# Patient Record
Sex: Male | Born: 1945
Health system: Southern US, Community
[De-identification: ages and names within clinical notes are randomized; demographics above are authoritative.]

## PROBLEM LIST (undated history)

## (undated) DIAGNOSIS — M1990 Unspecified osteoarthritis, unspecified site: Secondary | ICD-10-CM

## (undated) DIAGNOSIS — I10 Essential (primary) hypertension: Secondary | ICD-10-CM

## (undated) DIAGNOSIS — N400 Enlarged prostate without lower urinary tract symptoms: Secondary | ICD-10-CM

## (undated) DIAGNOSIS — G4733 Obstructive sleep apnea (adult) (pediatric): Secondary | ICD-10-CM

## (undated) DIAGNOSIS — J449 Chronic obstructive pulmonary disease, unspecified: Secondary | ICD-10-CM

## (undated) DIAGNOSIS — J309 Allergic rhinitis, unspecified: Secondary | ICD-10-CM

## (undated) DIAGNOSIS — K219 Gastro-esophageal reflux disease without esophagitis: Secondary | ICD-10-CM

## (undated) DIAGNOSIS — J189 Pneumonia, unspecified organism: Secondary | ICD-10-CM

## (undated) DIAGNOSIS — N529 Male erectile dysfunction, unspecified: Secondary | ICD-10-CM

## (undated) DIAGNOSIS — K439 Ventral hernia without obstruction or gangrene: Secondary | ICD-10-CM

## (undated) HISTORY — DX: Unspecified osteoarthritis, unspecified site: M19.90

## (undated) HISTORY — DX: Allergic rhinitis, unspecified: J30.9

## (undated) HISTORY — DX: Gastro-esophageal reflux disease without esophagitis: K21.9

## (undated) HISTORY — DX: Ventral hernia without obstruction or gangrene: K43.9

## (undated) HISTORY — PX: FINGER SURGERY: SHX640

## (undated) HISTORY — DX: Pneumonia, unspecified organism: J18.9

## (undated) HISTORY — DX: Essential (primary) hypertension: I10

## (undated) HISTORY — DX: Obstructive sleep apnea (adult) (pediatric): G47.33

## (undated) HISTORY — DX: Chronic obstructive pulmonary disease, unspecified: J44.9

## (undated) HISTORY — DX: Male erectile dysfunction, unspecified: N52.9

## (undated) HISTORY — DX: Benign prostatic hyperplasia without lower urinary tract symptoms: N40.0

## (undated) HISTORY — PX: HERNIA REPAIR: SHX51

---
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
## 1973-04-09 HISTORY — PX: OTHER SURGICAL HISTORY: SHX169

## 1979-04-10 HISTORY — PX: OTHER SURGICAL HISTORY: SHX169

## 2002-04-09 DIAGNOSIS — J189 Pneumonia, unspecified organism: Secondary | ICD-10-CM

## 2002-04-09 HISTORY — DX: Pneumonia, unspecified organism: J18.9

## 2013-04-27 DIAGNOSIS — K148 Other diseases of tongue: Secondary | ICD-10-CM | POA: Diagnosis not present

## 2013-04-27 DIAGNOSIS — L290 Pruritus ani: Secondary | ICD-10-CM | POA: Diagnosis not present

## 2013-04-27 DIAGNOSIS — H60399 Other infective otitis externa, unspecified ear: Secondary | ICD-10-CM | POA: Diagnosis not present

## 2013-05-04 DIAGNOSIS — H905 Unspecified sensorineural hearing loss: Secondary | ICD-10-CM | POA: Diagnosis not present

## 2013-05-04 DIAGNOSIS — K1321 Leukoplakia of oral mucosa, including tongue: Secondary | ICD-10-CM | POA: Diagnosis not present

## 2013-09-10 DIAGNOSIS — E785 Hyperlipidemia, unspecified: Secondary | ICD-10-CM | POA: Diagnosis not present

## 2013-09-10 DIAGNOSIS — I10 Essential (primary) hypertension: Secondary | ICD-10-CM | POA: Diagnosis not present

## 2013-09-10 DIAGNOSIS — N400 Enlarged prostate without lower urinary tract symptoms: Secondary | ICD-10-CM | POA: Diagnosis not present

## 2013-09-10 DIAGNOSIS — Z125 Encounter for screening for malignant neoplasm of prostate: Secondary | ICD-10-CM | POA: Diagnosis not present

## 2013-09-10 DIAGNOSIS — J449 Chronic obstructive pulmonary disease, unspecified: Secondary | ICD-10-CM | POA: Diagnosis not present

## 2014-01-12 DIAGNOSIS — J069 Acute upper respiratory infection, unspecified: Secondary | ICD-10-CM | POA: Diagnosis not present

## 2014-01-12 DIAGNOSIS — Z23 Encounter for immunization: Secondary | ICD-10-CM | POA: Diagnosis not present

## 2014-01-12 DIAGNOSIS — J439 Emphysema, unspecified: Secondary | ICD-10-CM | POA: Diagnosis not present

## 2014-03-15 DIAGNOSIS — J439 Emphysema, unspecified: Secondary | ICD-10-CM | POA: Diagnosis not present

## 2014-03-15 DIAGNOSIS — N400 Enlarged prostate without lower urinary tract symptoms: Secondary | ICD-10-CM | POA: Diagnosis not present

## 2014-03-15 DIAGNOSIS — I10 Essential (primary) hypertension: Secondary | ICD-10-CM | POA: Diagnosis not present

## 2014-05-24 DIAGNOSIS — J0100 Acute maxillary sinusitis, unspecified: Secondary | ICD-10-CM | POA: Diagnosis not present

## 2014-06-08 DIAGNOSIS — Z1389 Encounter for screening for other disorder: Secondary | ICD-10-CM | POA: Diagnosis not present

## 2014-06-08 DIAGNOSIS — K439 Ventral hernia without obstruction or gangrene: Secondary | ICD-10-CM | POA: Diagnosis not present

## 2014-06-08 DIAGNOSIS — Z9181 History of falling: Secondary | ICD-10-CM | POA: Diagnosis not present

## 2014-06-12 DIAGNOSIS — J0100 Acute maxillary sinusitis, unspecified: Secondary | ICD-10-CM | POA: Diagnosis not present

## 2014-06-12 DIAGNOSIS — J449 Chronic obstructive pulmonary disease, unspecified: Secondary | ICD-10-CM | POA: Diagnosis not present

## 2014-06-14 DIAGNOSIS — E669 Obesity, unspecified: Secondary | ICD-10-CM | POA: Diagnosis not present

## 2014-06-14 DIAGNOSIS — M6208 Separation of muscle (nontraumatic), other site: Secondary | ICD-10-CM | POA: Diagnosis not present

## 2014-06-14 DIAGNOSIS — Z6831 Body mass index (BMI) 31.0-31.9, adult: Secondary | ICD-10-CM | POA: Diagnosis not present

## 2014-07-13 DIAGNOSIS — N400 Enlarged prostate without lower urinary tract symptoms: Secondary | ICD-10-CM | POA: Diagnosis not present

## 2014-07-27 DIAGNOSIS — R3911 Hesitancy of micturition: Secondary | ICD-10-CM | POA: Diagnosis not present

## 2014-07-27 DIAGNOSIS — N401 Enlarged prostate with lower urinary tract symptoms: Secondary | ICD-10-CM | POA: Diagnosis not present

## 2014-08-11 DIAGNOSIS — N401 Enlarged prostate with lower urinary tract symptoms: Secondary | ICD-10-CM | POA: Diagnosis not present

## 2014-08-11 DIAGNOSIS — R3911 Hesitancy of micturition: Secondary | ICD-10-CM | POA: Diagnosis not present

## 2014-08-18 DIAGNOSIS — R3911 Hesitancy of micturition: Secondary | ICD-10-CM | POA: Diagnosis not present

## 2014-08-18 DIAGNOSIS — N401 Enlarged prostate with lower urinary tract symptoms: Secondary | ICD-10-CM | POA: Diagnosis not present

## 2014-08-31 DIAGNOSIS — B9689 Other specified bacterial agents as the cause of diseases classified elsewhere: Secondary | ICD-10-CM | POA: Diagnosis not present

## 2014-08-31 DIAGNOSIS — J208 Acute bronchitis due to other specified organisms: Secondary | ICD-10-CM | POA: Diagnosis not present

## 2014-09-13 DIAGNOSIS — Z79899 Other long term (current) drug therapy: Secondary | ICD-10-CM | POA: Diagnosis not present

## 2014-09-13 DIAGNOSIS — J449 Chronic obstructive pulmonary disease, unspecified: Secondary | ICD-10-CM | POA: Diagnosis not present

## 2014-09-13 DIAGNOSIS — I10 Essential (primary) hypertension: Secondary | ICD-10-CM | POA: Diagnosis not present

## 2014-10-14 DIAGNOSIS — Z125 Encounter for screening for malignant neoplasm of prostate: Secondary | ICD-10-CM | POA: Diagnosis not present

## 2014-10-14 DIAGNOSIS — N401 Enlarged prostate with lower urinary tract symptoms: Secondary | ICD-10-CM | POA: Diagnosis not present

## 2014-10-20 DIAGNOSIS — J449 Chronic obstructive pulmonary disease, unspecified: Secondary | ICD-10-CM | POA: Diagnosis not present

## 2014-10-22 ENCOUNTER — Encounter: Payer: Self-pay | Admitting: Pulmonary Disease

## 2014-10-25 ENCOUNTER — Ambulatory Visit (INDEPENDENT_AMBULATORY_CARE_PROVIDER_SITE_OTHER): Payer: Medicare Other | Admitting: Pulmonary Disease

## 2014-10-25 ENCOUNTER — Other Ambulatory Visit: Payer: Medicare Other

## 2014-10-25 ENCOUNTER — Encounter: Payer: Self-pay | Admitting: Pulmonary Disease

## 2014-10-25 ENCOUNTER — Ambulatory Visit (INDEPENDENT_AMBULATORY_CARE_PROVIDER_SITE_OTHER)
Admission: RE | Admit: 2014-10-25 | Discharge: 2014-10-25 | Disposition: A | Discharge status: Home / Self Care | Payer: Medicare Other | Source: Ambulatory Visit | Attending: Pulmonary Disease | Admitting: Pulmonary Disease

## 2014-10-25 ENCOUNTER — Encounter (INDEPENDENT_AMBULATORY_CARE_PROVIDER_SITE_OTHER): Payer: Self-pay

## 2014-10-25 VITALS — BP 142/88 | HR 56 | Temp 97.6°F | Ht 70.0 in | Wt 235.4 lb

## 2014-10-25 DIAGNOSIS — R3911 Hesitancy of micturition: Secondary | ICD-10-CM

## 2014-10-25 DIAGNOSIS — J309 Allergic rhinitis, unspecified: Secondary | ICD-10-CM

## 2014-10-25 DIAGNOSIS — J449 Chronic obstructive pulmonary disease, unspecified: Secondary | ICD-10-CM

## 2014-10-25 DIAGNOSIS — R0602 Shortness of breath: Secondary | ICD-10-CM | POA: Diagnosis not present

## 2014-10-25 DIAGNOSIS — R0600 Dyspnea, unspecified: Secondary | ICD-10-CM

## 2014-10-25 DIAGNOSIS — R0683 Snoring: Secondary | ICD-10-CM

## 2014-10-25 NOTE — Patient Instructions (Signed)
1)  Use over the counter nasal saline rinse (NeilMed) twice daily 2)  Continue inhalers as prescribed 3)  Checking chest X-ray, walk test, and breathing tests to determine your degree of COPD 4)  Checking you for genetic form of COPD 5)  Checking Sleep study to see if you have sleep apnea 6)  You will return to clinic in 4-6 weeks once the tests have been completed and we have obtained records from your old physician's office.

## 2014-10-25 NOTE — Progress Notes (Deleted)
   Subjective:    Patient ID: Daniel [name], male    DOB: [birth year], 69 y.o.   MRN: [id number]  HPI    Review of Systems  Constitutional: Negative for fever and unexpected weight change.  HENT: Negative for congestion, dental problem, ear pain, nosebleeds, postnasal drip, rhinorrhea, sinus pressure, sneezing, sore throat and trouble swallowing.   Eyes: Negative for redness and itching.  Respiratory: Negative for cough, chest tightness, shortness of breath and wheezing.   Cardiovascular: Negative for palpitations and leg swelling.  Gastrointestinal: Negative for nausea and vomiting.  Genitourinary: Negative for dysuria.  Musculoskeletal: Negative for joint swelling.  Skin: Negative for rash.  Neurological: Negative for headaches.  Hematological: Does not bruise/bleed easily.  Psychiatric/Behavioral: Negative for dysphoric mood. The patient is not nervous/anxious.        Objective:   Physical Exam        Assessment & Plan:

## 2014-10-25 NOTE — Progress Notes (Signed)
Subjective:    Patient ID: Daniel [name], male    DOB: April 20, 1945, 69 y.o.   MRN: [id number]  HPI He is being evaluated for removal of a bladder stone & difficulty urinating by Urology. He reports that in 2004 he was hospitalized with left-sided pneumonia and had significant "scar tissue" that resulted.  He denies any breathing problems or bronchitis as a child.  Now he gets bronchitis at least twice yearly.  He feels his dyspnea is worsening and he attributes this to weight gain.  He reports it occurs with exertion. He notes it also with bending over.  He can climb at least 3 flights of stairs without stopping. He denies any recurrent cough. He does have wheezing at times. He was diagnosed with COPD by his physician in [place], [place]. He has been been on Advair for at least a couple of years. He has no rescue inhaler currently. He previously was on Spiriva and was switched to [facility] in October 2015 without any significant benefit in terms of dyspnea relief. He denies any chest pain or pressure. No fever, chills, or sweats. No nausea, emesis or diarrhea. He does have frequent reflux induced by his diet. He uses Tums intermittently. He sleeps in a recliner. His wife reports that he snores and stops breathing at night while sleeping. He denies any headaches in the morning. He denies any routine naps. He does doze off easily. He does have problems urinating in particular starting his stream.   Review of Systems No rashes or bruising. No sinus drainage. Does have ongoing sinus congestion.A pertinent 14 point review of systems is negative except as per the history of presenting illness.     Allergies  Allergen Reactions  . Carafate [Sucralfate] Hives    Current outpatient prescriptions:  .  aspirin 81 MG tablet, Take 81 mg by mouth daily., Disp: , Rfl:  .  atenolol (TENORMIN) 25 MG tablet, Take 25 mg by mouth daily., Disp: , Rfl:  .  celecoxib (CELEBREX) 200 MG capsule, Take 200 mg by mouth daily.,  Disp: , Rfl:  .  Fluticasone-Salmeterol (ADVAIR) 250-50 MCG/DOSE AEPB, Inhale 1 puff into the lungs 2 (two) times daily., Disp: , Rfl:  .  pravastatin (PRAVACHOL) 20 MG tablet, Take 20 mg by mouth daily., Disp: , Rfl:  .  tadalafil (CIALIS) 5 MG tablet, Take 5 mg by mouth daily as needed for erectile dysfunction., Disp: , Rfl:  .  tamsulosin (FLOMAX) 0.4 MG CAPS capsule, Take 0.4 mg by mouth daily. 2 tablets once daily, Disp: , Rfl:  .  Tiotropium Bromide-Olodaterol (STIOLTO RESPIMAT) 2.5-2.5 MCG/ACT AERS, Inhale 2 puffs into the lungs daily., Disp: , Rfl:   Past Medical History  Diagnosis Date  . BPH (benign prostatic hyperplasia)   . COPD (chronic obstructive pulmonary disease)   . Abdominal wall hernia   . Pneumonia 2004    "Staph" infection in left lung  . Erectile dysfunction   . Arthritis   . Hypertension    Past Surgical History  Procedure Laterality Date  . Left knee surgery  1981  . Plastic surgery of forehead  1975  . Appendectomy  1966  . Hernia repair  2001, 2007, 2016  . Finger surgery Right     index finger   Family History  Problem Relation Age of Onset  . Colon cancer Paternal Grandfather   . Hernia Father   . Cancer Mother     breast, lung, liver  . Diabetes Maternal Grandfather   .  Heart disease Paternal Grandmother    History   Social History  . Marital Status: Married    Spouse Name: N/A  . Number of Children: N/A  . Years of Education: N/A   Occupational History  . retired    Social History Main Topics  . Smoking status: Current Some Day Smoker    Types: Cigars  . Smokeless tobacco: Not on file     Comment: only smoke a cigar when grilling out  . Alcohol Use: 0.0 oz/week    0 Standard drinks or equivalent per week  . Drug Use: Not on file  . Sexual Activity: Not on file   Other Topics Concern  . None   Social History Narrative   Quit smoking cigarettes in 2001. He smoked for 30 years at a peak rate of 2ppd. Has 2 dogs currently at  home. No prior bird exposure. 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 He recently moved to [place] from [place], [place].     Objective:   Physical Exam Blood pressure 142/88, pulse 56, temperature 97.6 F (36.4 C), temperature source Oral, height 5\' 10"  (1.778 m), weight 235 lb 6.4 oz (106.777 kg), SpO2 97 %. General:  Awake. Alert. No acute distress. Wife with the patient today. Integument:  Warm & dry. No rash on exposed skin. No bruising. Lymphatics:  No appreciated cervical or supraclavicular lymphadenoapthy. HEENT:  Moist mucus membranes. No oral ulcers. No scleral injection or icterus. PERRL. severely swollen bilateral nasal turbinates. Mallampati class III. Neck circumference 18 inches. Cardiovascular:  Regular rate. Trace lower extremity edema. No appreciable JVD.  Pulmonary:  Good aeration & clear to auscultation bilaterally. Symmetric chest wall expansion. No accessory muscle use. Abdomen: Soft. Normal bowel sounds. Nondistended. Grossly nontender. Ventral abdominal hernia noted. Musculoskeletal:  Normal bulk and tone. Hand grip strength 5/5 bilaterally. No joint deformity or effusion appreciated. Neurological:  CN 2-12 grossly in tact. No meningismus. Moving all 4 extremities equally. Symmetric patellar deep tendon reflexes. Psychiatric:  Mood and affect congruent. Speech normal rhythm, rate & tone.      Assessment & Plan:  69 year old male previously diagnosed with COPD with history of severe left-sided pneumonia hospitalized in 2004. Patient continues to have progressively worsening dyspnea on exertion that he attributes to weight gain and his underlying COPD. Advair and Stiolto have been of little help to the patient. Previously he was also on Spiriva. He is currently undergoing evaluation for a urological procedure under general anesthesia. In reviewing the  patient's history with him and his wife he seems to have symptoms suggestive of underlying sleep apnea. He admits he is incompletely adherent to his inhaler medications. Further investigation into the severity of the patient's underlying lung disease will need to be performed before an accurate risk assessment for his pending procedure can be done. Patient counseled to contact my office if he had any further questions or concerns before his next appointment.  1. COPD: Unclear severity but ongoing symptoms therefore uncontrolled. Checking for pulmonary function testing with bronchodilator challenge. Screening for alpha-1 antitrypsin with serum testing today. Patient continuing on his current inhaler regimen. 2. Snoring: Suspect underlying sleep apnea. Ordering polysomnogram. 3. Dyspnea: Checking chest x-ray PA/lateral & 6 minute walk test. Suspect secondary to underlying COPD. 4. Allergic rhinitis: Checking serum IgE & RAST panel. Patient counseled to use nasal saline  rinse twice daily. 5. Planned urological surgery under general anesthesia: Unable to accurately assess the patient's perioperative pulmonary risk of complications. I suspect he would be at moderate risk given his ongoing symptoms at this time. 6. Follow-up: Patient will return to clinic in 4-6 weeks or sooner if needed.

## 2014-10-26 LAB — IGE: IgE (Immunoglobulin E), Serum: 376 kU/L — ABNORMAL HIGH (ref <115)

## 2014-10-27 LAB — ALLERGEN PROFILE, PERENNIAL ALLERGEN IGE
Aspergillus Fumigatus IgE: <0.1 kU/L
Aureobasidi Pullulans IgE: <0.1 kU/L
Candida Albicans IgE: 0.23 kU/L — AB
Cat Dander IgE: <0.1 kU/L
Cladosporium Herbarum IgE: <0.1 kU/L
Cow Dander IgE: <0.1 kU/L
D Farinae IgE: <0.1 kU/L
Dog Dander IgE: <0.1 kU/L
Duck Feathers IgE: <0.1 kU/L
Goose Feathers IgE: <0.1 kU/L
Mouse Urine IgE: <0.1 kU/L
Mucor Racemosus IgE: <0.1 kU/L
Phoma Betae IgE: <0.1 kU/L
Setomelanomma Rostrat: <0.1 kU/L

## 2014-10-27 LAB — ALPHA-1-ANTITRYPSIN: A1 ANTITRYPSIN SER: 138 mg/dL (ref 83–199)

## 2014-11-07 ENCOUNTER — Ambulatory Visit (HOSPITAL_BASED_OUTPATIENT_CLINIC_OR_DEPARTMENT_OTHER): Payer: Medicare Other | Attending: Pulmonary Disease

## 2014-11-07 VITALS — Ht 70.0 in | Wt 230.0 lb

## 2014-11-07 DIAGNOSIS — R0683 Snoring: Secondary | ICD-10-CM | POA: Diagnosis not present

## 2014-11-07 DIAGNOSIS — G4733 Obstructive sleep apnea (adult) (pediatric): Secondary | ICD-10-CM | POA: Diagnosis not present

## 2014-11-07 DIAGNOSIS — G4736 Sleep related hypoventilation in conditions classified elsewhere: Secondary | ICD-10-CM | POA: Insufficient documentation

## 2014-11-07 DIAGNOSIS — G4730 Sleep apnea, unspecified: Secondary | ICD-10-CM

## 2014-11-08 ENCOUNTER — Ambulatory Visit: Payer: Medicare Other | Admitting: Pulmonary Disease

## 2014-11-08 ENCOUNTER — Ambulatory Visit (INDEPENDENT_AMBULATORY_CARE_PROVIDER_SITE_OTHER): Payer: Medicare Other | Admitting: Pulmonary Disease

## 2014-11-08 DIAGNOSIS — J449 Chronic obstructive pulmonary disease, unspecified: Secondary | ICD-10-CM

## 2014-11-08 DIAGNOSIS — R0600 Dyspnea, unspecified: Secondary | ICD-10-CM

## 2014-11-08 LAB — PULMONARY FUNCTION TEST
DL/VA % pred: 58 %
DL/VA: 2.68 ml/min/mmHg/L
DLCO UNC % PRED: 42 %
DLCO unc: 13.63 ml/min/mmHg
FEF 25-75 Post: 1.16 L/sec
FEF 25-75 Pre: 0.71 L/sec
FEF2575-%Change-Post: 63 %
FEF2575-%PRED-POST: 44 %
FEF2575-%PRED-PRE: 27 %
FEV1-%Change-Post: 18 %
FEV1-%Pred-Post: 67 %
FEV1-%Pred-Pre: 57 %
FEV1-Post: 2.26 L
FEV1-Pre: 1.9 L
FEV1FVC-%CHANGE-POST: 15 %
FEV1FVC-%Pred-Pre: 75 %
FEV6-%CHANGE-POST: 6 %
FEV6-%PRED-POST: 79 %
FEV6-%PRED-PRE: 74 %
FEV6-POST: 3.37 L
FEV6-Pre: 3.18 L
FEV6FVC-%Change-Post: 4 %
FEV6FVC-%PRED-POST: 102 %
FEV6FVC-%Pred-Pre: 97 %
FVC-%Change-Post: 3 %
FVC-%Pred-Post: 78 %
FVC-%Pred-Pre: 75 %
FVC-POST: 3.53 L
FVC-PRE: 3.42 L
POST FEV6/FVC RATIO: 97 %
PRE FEV1/FVC RATIO: 56 %
Post FEV1/FVC ratio: 64 %
Pre FEV6/FVC Ratio: 93 %
RV % pred: 108 %
RV: 2.61 L
TLC % pred: 89 %
TLC: 6.26 L

## 2014-11-08 NOTE — Progress Notes (Signed)
Six-Minute Walk Test 11/08/14:  Walked 492 meters / Baseline Sat 94% on Ra / Nadir Sat 90% on RA  PFT 11/08/14: FVC 3.42 L (75%) FEV1 1.90 L (57%) FEV1/FVC 0.56 FEF 25-75 0.71 L (27%) positive bronchodilator response TLC 6.26 L (89%) RV 180% ERV 68% DLCO uncorrected 42%

## 2014-11-16 DIAGNOSIS — G4733 Obstructive sleep apnea (adult) (pediatric): Secondary | ICD-10-CM | POA: Diagnosis not present

## 2014-11-17 ENCOUNTER — Telehealth: Payer: Self-pay | Admitting: *Deleted

## 2014-11-17 DIAGNOSIS — R3911 Hesitancy of micturition: Secondary | ICD-10-CM | POA: Diagnosis not present

## 2014-11-17 DIAGNOSIS — G4733 Obstructive sleep apnea (adult) (pediatric): Secondary | ICD-10-CM

## 2014-11-17 DIAGNOSIS — N401 Enlarged prostate with lower urinary tract symptoms: Secondary | ICD-10-CM | POA: Diagnosis not present

## 2014-11-17 NOTE — Telephone Encounter (Signed)
I spoke with patient about results and he verbalized understanding and had no questions. cpap titration ordered

## 2014-11-17 NOTE — Telephone Encounter (Signed)
-----   Message from Roslynn Amble, MD sent at 11/17/2014  4:55 PM EDT ----- Ami Thornsberry,   Please order a CPAP titration study for the patient's severe OSA and call him to let him know.   Thanks. ----- Message -----    From: Coralyn Helling, MD    Sent: 11/17/2014  10:49 AM      To: Roslynn Amble, MD  Marlyne Beards,  Sleep study for Mr. Knotts showed severe OSA (AHI 35.5, SaO2 low 81%).  Sleep study will be entered into epic later this week.  Thanks.  [place]

## 2014-11-22 NOTE — Progress Notes (Signed)
Patient Name: Daniel [name], Daniel [name] Date: 11/07/2014 Gender: Male D.O.B: [birth year] Age (years): 68 Referring Provider: Roslynn Amble Height (inches): 70 Interpreting Physician: Coralyn Helling MD, ABSM Weight (lbs): 230 RPSGT: Cherylann Parr BMI: 33 MRN: [id number] Neck Size: 17.00 CLINICAL INFORMATION Sleep Study Type: NPSG Indication for sleep study: Snoring, Witnesses Apnea / Gasping During Sleep Epworth Sleepiness Score: SLEEP STUDY TECHNIQUE As per the AASM Manual for the Scoring of Sleep and Associated Events v2.3 (April 2016) with a hypopnea requiring 4% desaturations. The channels recorded and monitored were frontal, central and occipital EEG, electrooculogram (EOG), submentalis EMG (chin), nasal and oral airflow, thoracic and abdominal wall motion, anterior tibialis EMG, snore microphone, electrocardiogram, and pulse oximetry. MEDICATIONS Patient's medications include: reviewed in electronic medical record. Medications self-administered by patient during sleep study : No sleep medicine administered. SLEEP ARCHITECTURE The study was initiated at 10:06:12 PM and ended at 4:59:35 AM. Sleep onset time was 7.3 minutes and the sleep efficiency was 83.4%. The total sleep time was 344.6 minutes. Stage REM latency was 120.0 minutes. The patient spent 11.61% of the night in stage N1 sleep, 73.16% in stage N2 sleep, 0.00% in stage N3 and 15.23% in REM. Alpha intrusion was absent. Supine sleep was 55.86%. RESPIRATORY PARAMETERS The overall apnea/hypopnea index (AHI) was 35.5 per hour. There were 197 total apneas, including 196 obstructive, 0 central and 1 mixed apneas. There were 7 hypopneas and 11 RERAs. The AHI during Stage REM sleep was 16.0 per hour. AHI while supine was 54.5 per hour. The mean oxygen saturation was 91.44%. The minimum SpO2 during sleep was 81.00%. Loud snoring was noted during this study. CARDIAC DATA The 2 lead EKG demonstrated sinus rhythm. The mean heart rate  was 55.43 beats per minute. Other EKG findings include: None. LEG MOVEMENT DATA The total PLMS were 40 with a resulting PLMS index of 6.96. Associated arousal with leg movement index was 0.0. IMPRESSIONS Severe obstructive sleep apnea occurred during this study (AHI = 35.5/h). No significant central sleep apnea occurred during this study (CAI = 0.0/h). Mild oxygen desaturation was noted during this study (Min O2 = 81.00%). The patient snored with Loud snoring volume. No cardiac abnormalities were noted during this study. Mild periodic limb movements of sleep occurred during the study. No significant associated arousals. DIAGNOSIS Obstructive Sleep Apnea (327.23 [G47.33 ICD-10]) Nocturnal Hypoxemia (327.26 [G47.36 ICD-10]) RECOMMENDATIONS Additional therapies include CPAP, oral appliance, or surgical assessment. Avoid alcohol, sedatives and other CNS depressants that may worsen sleep apnea and disrupt normal sleep architecture. Sleep hygiene should be reviewed to assess factors that may improve sleep quality. Weight management and regular exercise should be initiated or continued if appropriate.  [Electronically signed] 11/17/2014 10:49 AM Coralyn Helling MD, ABSM Diplomate, American Board of Sleep Medicine NPI: [id number]

## 2014-12-06 ENCOUNTER — Encounter: Payer: Self-pay | Admitting: Pulmonary Disease

## 2014-12-06 ENCOUNTER — Ambulatory Visit (INDEPENDENT_AMBULATORY_CARE_PROVIDER_SITE_OTHER): Payer: Medicare Other | Admitting: Pulmonary Disease

## 2014-12-06 ENCOUNTER — Other Ambulatory Visit (INDEPENDENT_AMBULATORY_CARE_PROVIDER_SITE_OTHER): Payer: Medicare Other

## 2014-12-06 VITALS — BP 126/72 | HR 70 | Ht 70.0 in | Wt 241.6 lb

## 2014-12-06 DIAGNOSIS — R011 Cardiac murmur, unspecified: Secondary | ICD-10-CM

## 2014-12-06 DIAGNOSIS — J449 Chronic obstructive pulmonary disease, unspecified: Secondary | ICD-10-CM | POA: Diagnosis not present

## 2014-12-06 DIAGNOSIS — G4734 Idiopathic sleep related nonobstructive alveolar hypoventilation: Secondary | ICD-10-CM

## 2014-12-06 DIAGNOSIS — G4733 Obstructive sleep apnea (adult) (pediatric): Secondary | ICD-10-CM | POA: Diagnosis not present

## 2014-12-06 DIAGNOSIS — R0600 Dyspnea, unspecified: Secondary | ICD-10-CM

## 2014-12-06 DIAGNOSIS — R0609 Other forms of dyspnea: Secondary | ICD-10-CM | POA: Diagnosis not present

## 2014-12-06 DIAGNOSIS — R609 Edema, unspecified: Secondary | ICD-10-CM

## 2014-12-06 DIAGNOSIS — J309 Allergic rhinitis, unspecified: Secondary | ICD-10-CM

## 2014-12-06 LAB — BRAIN NATRIURETIC PEPTIDE: Pro B Natriuretic peptide (BNP): 68 pg/mL (ref 0.0–100.0)

## 2014-12-06 MED ORDER — BUDESONIDE-FORMOTEROL FUMARATE 160-4.5 MCG/ACT IN AERO: 2.0000 | INHALATION_SPRAY | Freq: Two times a day (BID) | RESPIRATORY_TRACT | Status: DC

## 2014-12-06 MED ORDER — ALBUTEROL SULFATE HFA 108 (90 BASE) MCG/ACT IN AERS: 2.0000 | INHALATION_SPRAY | RESPIRATORY_TRACT | Status: DC | PRN

## 2014-12-06 MED ORDER — TIOTROPIUM BROMIDE MONOHYDRATE 1.25 MCG/ACT IN AERS: 2.0000 | INHALATION_SPRAY | Freq: Every day | RESPIRATORY_TRACT | Status: DC

## 2014-12-06 NOTE — Progress Notes (Signed)
Subjective:    Patient ID: Daniel [name], male    DOB: 03/14/1946, 69 y.o.   MRN: [id number]  C.C.:  Follow-up for Moderate-Severe COPD, Allergic Rhinitis, & Severe OSA.  HPI Moderate-Severe COPD:  Reports he has been compliant with his Advair & Stiolto. Hasn't noticed a significant change since switching from Spiriva [employer] to [employer]. Continues to have dyspnea on exertion. He continues to have intermittent wheezing. Still having intermittent cough that he describes as being triggered by an "itch" in the back of his throat. He doesn't have a rescue inhaler. No nocturnal awakenings with coughing.  Allergic Rhinitis: IgE was elevated on serum testing but RAST panel was unrevealing. Patient counseled to use nasal saline rinse twice daily. He hasn't noticed a significant difference. Denies any changes in his baseline sinus congestion or pressure.  Severe OSA: Since last appointment patient underwent polysomnogram which showed severe OSA without significant central sleep apnea. Patient was also found to have nocturnal hypoxemia during testing. He has been scheduled for a CPAP titration.  Review of Systems He denies any chest pain or pressure. No nausea, emesis, or diarrhea.     Allergies  Allergen Reactions  . Carafate [Sucralfate] Hives    Current outpatient prescriptions:  .  aspirin 81 MG tablet, Take 81 mg by mouth daily., Disp: , Rfl:  .  atenolol (TENORMIN) 25 MG tablet, Take 25 mg by mouth daily., Disp: , Rfl:  .  celecoxib (CELEBREX) 200 MG capsule, Take 200 mg by mouth daily., Disp: , Rfl:  .  Fluticasone-Salmeterol (ADVAIR) 250-50 MCG/DOSE AEPB, Inhale 1 puff into the lungs 2 (two) times daily., Disp: , Rfl:  .  pravastatin (PRAVACHOL) 20 MG tablet, Take 20 mg by mouth daily., Disp: , Rfl:  .  tadalafil (CIALIS) 5 MG tablet, Take 5 mg by mouth daily as needed for erectile dysfunction., Disp: , Rfl:  .  tamsulosin (FLOMAX) 0.4 MG CAPS capsule, Take 0.4 mg by mouth daily. 2  tablets once daily, Disp: , Rfl:  .  Tiotropium Bromide-Olodaterol (STIOLTO RESPIMAT) 2.5-2.5 MCG/ACT AERS, Inhale 2 puffs into the lungs daily., Disp: , Rfl:   Past Medical History  Diagnosis Date  . BPH (benign prostatic hyperplasia)   . COPD (chronic obstructive pulmonary disease)   . Abdominal wall hernia   . Pneumonia 2004    "Staph" infection in left lung  . Erectile dysfunction   . Arthritis   . Hypertension   . GERD (gastroesophageal reflux disease)   . OSA (obstructive sleep apnea)   . Allergic rhinitis    Past Surgical History  Procedure Laterality Date  . Left knee surgery  1981  . Plastic surgery of forehead  1975  . Appendectomy  1966  . Hernia repair  2001, 2007, 2016  . Finger surgery Right     index finger   Family History  Problem Relation Age of Onset  . Colon cancer Paternal Grandfather   . Hernia Father   . Cancer Mother     breast, lung, liver  . Diabetes Maternal Grandfather   . Heart disease Paternal Grandmother    Social History   Social History  . Marital Status: Married    Spouse Name: N/A  . Number of Children: N/A  . Years of Education: N/A   Occupational History  . retired    Social History Main Topics  . Smoking status: Current Some Day Smoker    Types: Cigars  . Smokeless tobacco: Not on file  Comment: only smoke a cigar when grilling out  . Alcohol Use: 0.0 oz/week    0 Standard drinks or equivalent per week  . Drug Use: Not on file  . Sexual Activity: Not on file   Other Topics Concern  . Not on file   Social History Narrative   Quit smoking cigarettes in 2001. He smoked for 30 years at a peak rate of 2ppd. Has 2 dogs currently at home. No prior bird exposure. 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 He recently moved to [place] from [place], [place].     Objective:   Physical Exam Blood  pressure 126/72, pulse 70, height 5\' 10"  (1.778 m), weight 241 lb 9.6 oz (109.589 kg), SpO2 94 %. General:  Awake. Alert. Obese Caucasian male seen. Wife again with the patient today. Integument:  Warm & dry. No rash on exposed skin. No bruising. HEENT:  Moist mucus membranes. No oral ulcers. Swollen bilateral nasal turbinates right greater than left with moderate obstruction in the nasal passage on the right. Cardiovascular:  Regular rate. Regular rhythm. 1+ pitting bilateral lower extremity edema increased since last appointment.  Pulmonary:  Good aeration bilaterally but very mild crackles bilateral lung bases. Symmetric chest wall expansion. No accessory muscle use on room air. Abdomen: Soft. Normal bowel sounds. Protuberant. Grossly nontender. Ventral abdominal hernia noted. Musculoskeletal:  Normal bulk and tone. Hand grip strength 5/5 bilaterally. No joint deformity or effusion appreciated.  PFT 11/08/14: FVC 3.42 L (75%) FEV1 1.90 L (57%) FEV1/FVC 0.56 FEF 25-75 0.71 L (27%) positive bronchodilator response TLC 6.26 L (89%) RV 180% ERV 68% DLCO uncorrected 42%  11/08/14: Walked 492 meters / Baseline Sat 94% on RA / Nadir Sat 90% on RA  POLYSOMNOGRAM 11/08/14:  Severe OSA with AHI 35.5 events/hour. Central sleep apnea 0.0 events/hour. Lowest saturation 81% during study. No cardiac abnormalities were noted. Mild periodic limb movement of sleep occurred during study without significant arousal.  IMAGING CXR PA/LAT 10/25/14 (personally reviewed by me): Mild hyperinflation with flattening of the diaphragms. No parenchymal nodule or opacity appreciated. No pleural effusion or thickening appreciated. Heart normal in size. Mediastinum normal in contour.  LABS 10/25/14 Alpha-1 antitrypsin: 138 IgE: 376 RAST panel: Candida albicans 0.23 (class 0/1)    Assessment & Plan:  69 year old male with proposed urological surgery under general anesthesia. Given the severity of the patient's COPD  and obstructive sleep apnea I believe he remains at a moderate risk of perioperative pulmonary complications. I am attempting to adjust his inhaler regimen to improve drug delivery and maximize his bronchodilatation in an effort to improve his exercise capacity and control of symptoms. His previous spirometry did show moderate-severe COPD with a significant bronchodilator response. Additionally he has trapping on his lung volumes as well as a substantially decreased carbon dioxide diffusion capacity. Despite this the patient had no significant desaturation during his 6 minute walk test. I personally reviewed the results of his chest x-ray and polysomnogram which have been performed since last appointment. His polysomnogram does have severe obstructive sleep apnea and we reviewed the risk factors for untreated sleep apnea during his visit today with his wife present. Given the degree of obstruction in his right nasal passage I feel it's reasonable to refer him to ENT for evaluation and potential treatment options. As an additional thought of concern his  lower extremity edema has increased significantly since last appointment and when coupled with the mild crackles on physical exam, the severity of his lung disease, and severity of his obstructive sleep apnea I am concerned about the potential for cor pulmonale/pulmonary hypertension. I am investigating this further with serum testing as well as with a transthoracic echocardiogram. I instructed the patient on proper technique with both of his new inhalers and instructed him to contact my office if he had any further questions or concerns before his next appointment.  1. Moderate-severe COPD: Checking alpha-1 antitrypsin phenotype/genotype today. Stopping Advair & Stiolto. Starting Symbicort 160/4.5 with spacer & Spiriva Respimat. Also prescribing albuterol inhaler to use as needed. Plan to repeat spirometry with bronchodilator challenge at next appointment to ensure  maximal bronchodilatation as well as 6 minute walk test at next appointment to examine functional capacity. 2. Severe OSA with nocturnal hypoxia: Patient is currently scheduled for a CPAP titration. He will keep this appointment. I will plan to see the patient back 30 days after he initiates CPAP therapy with a compliance download. 3. Allergic rhinitis/rhinosinusitis/obstruction of right nasal passage: Referring the patient to ENT for evaluation and treatment options. Continuing nasal saline rinse twice daily. 4. Bilateral lower extremity edema: Checking serum BNP today. Also checking transthoracic echocardiogram to evaluate right ventricular function. 5. General anesthesia for urological procedure: Patient is a moderate risk of perioperative complications from the patient's proposed surgery with general anesthesia. I'm adjusting his inhaled medication regimen today and I will reassess the patient back quickly. However, this should not preclude his ability to undergo the procedure whenever he and his surgeon wish to undertake it with the appropriate precautions by anesthesia. Albuterol & ipratropium bromide nebulizers can be used for any wheezing on physical exam before, during, or postintubation. I would also recommend minimizing sedating medications prior to extubation to prevent any worsening of CO2 retention from his OSA. 6. Follow-up: Patient to return to clinic in 4-6 weeks.

## 2014-12-06 NOTE — Patient Instructions (Signed)
1. You will be stopping Stiolto & Advair today. 2. Start taking Symbicort inhaler with a spacer twice daily as directed. Please remember to rinse, gargle, and spit afterwards to avoid getting thrush. Remember to rinse/washout your spacer twice a day after using it. 3. Start taking Spiriva Respimat inhaler tomorrow morning. 4. I'm checking a blood test to determine if the swelling in her legs is from your heart as well as an echocardiogram of your heart to see how it is working. 5. We will repeat a breathing test and walking test at her next appointment. 6. You will return to clinic in 4-6 weeks but please contact my office if you have any further questions or concerns.

## 2014-12-08 LAB — ALPHA-1-ANTITRYPSIN: A1 ANTITRYPSIN SER: 133 mg/dL (ref 83–199)

## 2014-12-09 ENCOUNTER — Ambulatory Visit: Payer: Medicare Other | Admitting: Pulmonary Disease

## 2014-12-10 ENCOUNTER — Other Ambulatory Visit: Payer: Self-pay

## 2014-12-10 ENCOUNTER — Ambulatory Visit (HOSPITAL_COMMUNITY): Payer: Medicare Other | Attending: Cardiology

## 2014-12-10 DIAGNOSIS — I517 Cardiomegaly: Secondary | ICD-10-CM | POA: Diagnosis not present

## 2014-12-10 DIAGNOSIS — F17200 Nicotine dependence, unspecified, uncomplicated: Secondary | ICD-10-CM | POA: Insufficient documentation

## 2014-12-10 DIAGNOSIS — R609 Edema, unspecified: Secondary | ICD-10-CM

## 2014-12-10 DIAGNOSIS — R0600 Dyspnea, unspecified: Secondary | ICD-10-CM

## 2014-12-10 DIAGNOSIS — G4733 Obstructive sleep apnea (adult) (pediatric): Secondary | ICD-10-CM

## 2014-12-10 DIAGNOSIS — J449 Chronic obstructive pulmonary disease, unspecified: Secondary | ICD-10-CM | POA: Diagnosis not present

## 2014-12-10 DIAGNOSIS — R011 Cardiac murmur, unspecified: Secondary | ICD-10-CM

## 2014-12-10 DIAGNOSIS — G4734 Idiopathic sleep related nonobstructive alveolar hypoventilation: Secondary | ICD-10-CM | POA: Diagnosis not present

## 2014-12-10 DIAGNOSIS — I77819 Aortic ectasia, unspecified site: Secondary | ICD-10-CM | POA: Diagnosis not present

## 2014-12-10 DIAGNOSIS — J309 Allergic rhinitis, unspecified: Secondary | ICD-10-CM

## 2014-12-10 LAB — ALPHA-1 ANTITRYPSIN PHENOTYPE: A-1 Antitrypsin: 135 mg/dL (ref 83–199)

## 2014-12-14 ENCOUNTER — Telehealth: Payer: Self-pay | Admitting: Pulmonary Disease

## 2014-12-14 NOTE — Telephone Encounter (Signed)
Spoke with pt, reviewed how to use his maintenance and prn inhalers.  Nothing further needed.

## 2014-12-17 ENCOUNTER — Telehealth: Payer: Self-pay | Admitting: Pulmonary Disease

## 2014-12-17 DIAGNOSIS — R0600 Dyspnea, unspecified: Secondary | ICD-10-CM

## 2014-12-17 DIAGNOSIS — J449 Chronic obstructive pulmonary disease, unspecified: Secondary | ICD-10-CM

## 2014-12-17 DIAGNOSIS — J342 Deviated nasal septum: Secondary | ICD-10-CM | POA: Diagnosis not present

## 2014-12-17 DIAGNOSIS — E669 Obesity, unspecified: Secondary | ICD-10-CM | POA: Diagnosis not present

## 2014-12-17 DIAGNOSIS — G4733 Obstructive sleep apnea (adult) (pediatric): Secondary | ICD-10-CM

## 2014-12-17 DIAGNOSIS — J309 Allergic rhinitis, unspecified: Secondary | ICD-10-CM

## 2014-12-17 DIAGNOSIS — Z6834 Body mass index (BMI) 34.0-34.9, adult: Secondary | ICD-10-CM | POA: Diagnosis not present

## 2014-12-17 DIAGNOSIS — R011 Cardiac murmur, unspecified: Secondary | ICD-10-CM

## 2014-12-17 DIAGNOSIS — G4734 Idiopathic sleep related nonobstructive alveolar hypoventilation: Secondary | ICD-10-CM

## 2014-12-17 DIAGNOSIS — R609 Edema, unspecified: Secondary | ICD-10-CM

## 2014-12-17 NOTE — Telephone Encounter (Signed)
Called [facility]. Pt symbicort needs PA. Pt has [employer] insurance phone # [phone] ID # [id number] Called and was advised wait time was 45 min d/t higher call volume. WCB to start PA

## 2014-12-20 NOTE — Telephone Encounter (Signed)
PA initiated through [email or web address] Key: [id number] Awaiting response.

## 2014-12-22 NOTE — Telephone Encounter (Signed)
Double check with the patient but I believe he was on Advair which I was switching to Symbicort given his symptoms and spirometry from 8/1. This constitutes a fail for me. JN

## 2014-12-22 NOTE — Telephone Encounter (Signed)
Pt has tried advair in past. Form in my inbox for Dr. Jamison Neighbor to sign and will fax back.

## 2014-12-22 NOTE — Telephone Encounter (Signed)
Pt must first try and fail advair diskus or advair HFA. Please advise Dr. Jamison Neighbor thanks

## 2014-12-23 NOTE — Telephone Encounter (Signed)
Form signed and faxed back. Will await response 

## 2014-12-29 MED ORDER — BUDESONIDE-FORMOTEROL FUMARATE 160-4.5 MCG/ACT IN AERO: 2.0000 | INHALATION_SPRAY | Freq: Two times a day (BID) | RESPIRATORY_TRACT | Status: DC

## 2014-12-29 NOTE — Telephone Encounter (Signed)
[facility]. PA for symbicort was approved for lifetime Case # [id number] Called [facility] and made aware of approval. Pt is also aware.

## 2014-12-30 ENCOUNTER — Telehealth: Payer: Self-pay | Admitting: Pulmonary Disease

## 2014-12-30 NOTE — Telephone Encounter (Signed)
Spoke with pt. He reports the symbicort is causing him to have a lot of heartburn and indegestion, eating lots of tums. He still has advair and stiolto on hand. He is still taking the spiriva and seems to be doing fine. Please advise Dr. Jamison Neighbor   Cell# [phone]

## 2015-01-03 MED ORDER — NYSTATIN 100000 UNIT/ML MT SUSP: 5.0000 mL | Freq: Four times a day (QID) | OROMUCOSAL | Status: DC

## 2015-01-03 NOTE — Telephone Encounter (Signed)
Continue the Spiriva as he has been. Send in a rx for nystatin swish & swallow to use qid for 7 days. Thanks.

## 2015-01-03 NOTE — Telephone Encounter (Signed)
Dr. Nestor please advise. Thanks. 

## 2015-01-03 NOTE — Telephone Encounter (Signed)
Spoke with pt. He is aware of JN's recommendation. Rx for Nystatin has been sent in.

## 2015-01-03 NOTE — Telephone Encounter (Signed)
[phone] returning call

## 2015-01-03 NOTE — Telephone Encounter (Signed)
lmtcb x1 for pt. 

## 2015-01-10 ENCOUNTER — Encounter (HOSPITAL_BASED_OUTPATIENT_CLINIC_OR_DEPARTMENT_OTHER): Payer: Medicare Other

## 2015-01-10 DIAGNOSIS — Z23 Encounter for immunization: Secondary | ICD-10-CM | POA: Diagnosis not present

## 2015-01-10 DIAGNOSIS — B029 Zoster without complications: Secondary | ICD-10-CM | POA: Diagnosis not present

## 2015-01-10 DIAGNOSIS — B356 Tinea cruris: Secondary | ICD-10-CM | POA: Diagnosis not present

## 2015-01-18 ENCOUNTER — Ambulatory Visit (INDEPENDENT_AMBULATORY_CARE_PROVIDER_SITE_OTHER): Payer: Medicare Other | Admitting: Pulmonary Disease

## 2015-01-18 ENCOUNTER — Encounter: Payer: Self-pay | Admitting: Pulmonary Disease

## 2015-01-18 VITALS — BP 130/78 | HR 64 | Ht 70.0 in | Wt 227.0 lb

## 2015-01-18 DIAGNOSIS — G4733 Obstructive sleep apnea (adult) (pediatric): Secondary | ICD-10-CM | POA: Diagnosis not present

## 2015-01-18 DIAGNOSIS — R0600 Dyspnea, unspecified: Secondary | ICD-10-CM

## 2015-01-18 DIAGNOSIS — G4734 Idiopathic sleep related nonobstructive alveolar hypoventilation: Secondary | ICD-10-CM

## 2015-01-18 DIAGNOSIS — J449 Chronic obstructive pulmonary disease, unspecified: Secondary | ICD-10-CM

## 2015-01-18 DIAGNOSIS — K219 Gastro-esophageal reflux disease without esophagitis: Secondary | ICD-10-CM

## 2015-01-18 DIAGNOSIS — R609 Edema, unspecified: Secondary | ICD-10-CM

## 2015-01-18 DIAGNOSIS — J3089 Other allergic rhinitis: Secondary | ICD-10-CM

## 2015-01-18 DIAGNOSIS — R011 Cardiac murmur, unspecified: Secondary | ICD-10-CM

## 2015-01-18 DIAGNOSIS — J309 Allergic rhinitis, unspecified: Secondary | ICD-10-CM

## 2015-01-18 LAB — PULMONARY FUNCTION TEST
DL/VA % pred: 48 %
DL/VA: 2.24 ml/min/mmHg/L
DLCO UNC: 13.49 ml/min/mmHg
DLCO unc % pred: 41 %
FEF 25-75 POST: 1.27 L/s
FEF 25-75 Pre: 1.19 L/sec
FEF2575-%Change-Post: 7 %
FEF2575-%Pred-Post: 49 %
FEF2575-%Pred-Pre: 46 %
FEV1-%CHANGE-POST: 2 %
FEV1-%PRED-POST: 74 %
FEV1-%PRED-PRE: 72 %
FEV1-POST: 2.47 L
FEV1-PRE: 2.41 L
FEV1FVC-%Change-Post: 1 %
FEV1FVC-%PRED-PRE: 88 %
FEV6-%Change-Post: 0 %
FEV6-%PRED-POST: 85 %
FEV6-%PRED-PRE: 84 %
FEV6-POST: 3.62 L
FEV6-Pre: 3.59 L
FEV6FVC-%CHANGE-POST: 0 %
FEV6FVC-%PRED-POST: 102 %
FEV6FVC-%Pred-Pre: 102 %
FVC-%Change-Post: 0 %
FVC-%PRED-PRE: 82 %
FVC-%Pred-Post: 83 %
FVC-POST: 3.73 L
FVC-PRE: 3.7 L
POST FEV6/FVC RATIO: 97 %
PRE FEV6/FVC RATIO: 97 %
Post FEV1/FVC ratio: 66 %
Pre FEV1/FVC ratio: 65 %
RV % PRED: 90 %
RV: 2.19 L
TLC % pred: 88 %
TLC: 6.21 L

## 2015-01-18 NOTE — Progress Notes (Addendum)
Subjective:    Patient ID: Daniel [name], male    DOB: 1945-10-30, 69 y.o.   MRN: [id number]  C.C.:  Follow-up for Moderate-Severe COPD, Allergic Rhinitis, Severe OSA, & GERD/dyspepsia.  HPI Moderate-Severe COPD:  Previously on Advair & Stiolto. Switch to Spiriva & Symbicort was last appointment. Patient reported significant heartburn and indigestion with using Symbicort. Phoned in prescription for nystatin swish and swallow. Reports his dyspepsia resolved. Denies any oral thrush. Has continued using Symbicort & Spiriva. He denies any coughing and only has rare wheezing with exertion. He still has dyspnea on exertion. Denies any exacerbations since last appointment. Hasn't used his rescue inhaler.  Allergic Rhinitis: IgE was elevated on serum testing but RAST panel was unrevealing. Patient instructed to use nasal saline rinse twice daily at last appointment. Referred to ENT. He has been evaluated by Dr. Pollyann Kennedy and plans for a surgical correction in January. He reports his sinus congestion has improved since last appointment. Denies any sinus pressure or pain.  Severe OSA: Patient had significant nocturnal hypoxia during polysomnogram. Patient scheduled for CPAP titration on 10/17. Echocardiogram performed after last appointment showed no evidence for RV dysfunction. He has gone on a high protein diet and also taking HCG as well as B6. He has lost 8 pounds over the last week.  GERD/dyspepsia: Occurred with Symbicort. Patient was using Tums. Prescribed nystatin swish and swallow. No reflux or dyspepsia recently. Denies any abdominal pain, nausea, or emesis. No dysphagia or odynophagia. Denies any morning brash water taste in his mouth.  Review of Systems No chest pain or pressure. No fever, chills, or sweats.    Allergies  Allergen Reactions  . Carafate [Sucralfate] Hives    Current outpatient prescriptions:  .  albuterol (PROAIR HFA) 108 (90 BASE) MCG/ACT inhaler, Inhale 2 puffs into the  lungs every 4 (four) hours as needed for wheezing or shortness of breath., Disp: 1 Inhaler, Rfl: 0 .  aspirin 81 MG tablet, Take 81 mg by mouth daily., Disp: , Rfl:  .  atenolol (TENORMIN) 25 MG tablet, Take 25 mg by mouth daily., Disp: , Rfl:  .  budesonide-formoterol (SYMBICORT) 160-4.5 MCG/ACT inhaler, Inhale 2 puffs into the lungs 2 (two) times daily., Disp: 3 Inhaler, Rfl: 1 .  celecoxib (CELEBREX) 200 MG capsule, Take 200 mg by mouth daily., Disp: , Rfl:  .  pravastatin (PRAVACHOL) 20 MG tablet, Take 20 mg by mouth daily., Disp: , Rfl:  .  PRESCRIPTION MEDICATION, HCG ? Dose once daily for weight loss, Disp: , Rfl:  .  tadalafil (CIALIS) 5 MG tablet, Take 5 mg by mouth daily as needed for erectile dysfunction., Disp: , Rfl:  .  tamsulosin (FLOMAX) 0.4 MG CAPS capsule, Take 0.4 mg by mouth daily. 2 tablets once daily, Disp: , Rfl:  .  Tiotropium Bromide Monohydrate (SPIRIVA RESPIMAT) 1.25 MCG/ACT AERS, Inhale 2 puffs into the lungs daily., Disp: 1 Inhaler, Rfl: 0  Past Medical History  Diagnosis Date  . BPH (benign prostatic hyperplasia)   . COPD (chronic obstructive pulmonary disease) (HCC)   . Abdominal wall hernia   . Pneumonia 2004    "Staph" infection in left lung  . Erectile dysfunction   . Arthritis   . Hypertension   . GERD (gastroesophageal reflux disease)   . OSA (obstructive sleep apnea)   . Allergic rhinitis    Past Surgical History  Procedure Laterality Date  . Left knee surgery  1981  . Plastic surgery of forehead  1975  .  Appendectomy  1966  . Hernia repair  2001, 2007, 2016  . Finger surgery Right     index finger   Family History  Problem Relation Age of Onset  . Colon cancer Paternal Grandfather   . Hernia Father   . Cancer Mother     breast, lung, liver  . Diabetes Maternal Grandfather   . Heart disease Paternal Grandmother    Social History   Social History  . Marital Status: Married    Spouse Name: N/A  . Number of Children: N/A  . Years of  Education: N/A   Occupational History  . retired    Social History Main Topics  . Smoking status: Current Some Day Smoker    Types: Cigars  . Smokeless tobacco: None     Comment: only smoke a cigar when grilling out  . Alcohol Use: 0.0 oz/week    0 Standard drinks or equivalent per week  . Drug Use: None  . Sexual Activity: Not Asked   Other Topics Concern  . None   Social History Narrative   Quit smoking cigarettes in 2001. He smoked for 30 years at a peak rate of 2ppd. Has 2 dogs currently at home. No prior bird exposure. 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 He recently moved to [place] from [place], [place].     Objective:   Physical Exam Blood pressure 130/78, pulse 64, height  (1.778 m), weight 227 lb (102.967 kg), SpO2 97 %. General: Obese Caucasian male. Accompanied by wife. No distress. Integument:  Warm & dry. No rash or bruising on exposed skin. HEENT:  Bilateral nasal turbinate swelling again present. Moist mucous membranes. No oral ulcers or thrush. Cardiovascular:  Regular rate. Regular rhythm. No edema.  Pulmonary:  Normal work of breathing on room air. Her auscultation bilaterally. Speaking in complete sentences. Abdomen: Soft. Normal bowel sounds. Nontender. Protuberant. Musculoskeletal:  Normal bulk and tone. Hand grip strength 5/5 bilaterally. No joint deformity or effusion appreciated.  PFT 01/18/15: FVC 3.70 L (82%) FEV1 2.41 L (72%) FEV1/FVC 0.65 FEF 25-75 1.19 L (46%) negative bronchodilator response TLC 6.21 L (80%) RV 90% ERV 96% DLCO uncorrected 41% 11/08/14: FVC 3.42 L (75%) FEV1 1.90 L (57%) FEV1/FVC 0.56 FEF 25-75 0.71 L (27%) positive bronchodilator response TLC 6.26 L (89%) RV 180% ERV 68% DLCO uncorrected 42%  01/18/15:  Walked 528 meters / Baseline Sat 97% on RA / Nadir Sat 92% on RA 11/08/14: Walked 492  meters / Baseline Sat 94% on RA / Nadir Sat 90% on RA  POLYSOMNOGRAM 11/08/14:  Severe OSA with AHI 35.5 events/hour. Central sleep apnea 0.0 events/hour. Lowest saturation 81% during study. No cardiac abnormalities were noted. Mild periodic limb movement of sleep occurred during study without significant arousal.  IMAGING CXR PA/LAT 10/25/14 (previously reviewed by me): Mild hyperinflation with flattening of the diaphragms. No parenchymal nodule or opacity appreciated. No pleural effusion or thickening appreciated. Heart normal in size. Mediastinum normal in contour.  CARDIAC TTE (12/10/14): LV normal in size. EF 65-70%. No regional wall motion abnormalities. Grade 1 diastolic dysfunction. LA & RA mildly dilated. RV normal in size and function. Unable to estimate pulmonary artery systolic pressure. No aortic stenosis or regurgitation. No mitral stenosis or regurgitation. Trivial pulmonic regurgitation. Trivial tricuspid regurgitation. No pericardial effusion. Dilated aortic root measuring 4.3 cm &  ascending aorta measuring 4.1 cm.  LABS 12/06/14 Pro-BNP: 68.0 Alpha-1 antitrypsin: (MM) 133   10/25/14 Alpha-1 antitrypsin: 138 IgE: 376 RAST panel: Candida albicans 0.23 (class 0/1)    Assessment & Plan:  69 year old male with underlying moderate-severe COPD, severe OSA with nocturnal hypoxia, & allergic rhinitis/rhinosinusitis. I reviewed the patient's memory function testing from today which shows only mild airways obstruction and a significant improvement in his spirometry on his current inhaler regimen. He has had resolution of his bronchodilator response on his current regimen as well. I believe his prior dyspepsia & reflux symptoms may have been caused by reflux but could also have been something more occult. I urged the patient to seek immediate medical attention if this occurred again. He's had no further reflux or dyspepsia that I can elicit from history. The patient is scheduled for a CPAP  titration on Monday. I also reviewed the results of his transthoracic echocardiogram with both he and his wife who is present today. I encouraged the patient to continue with his weight loss and portion control. I instructed the patient to contact my office if he develops any new breathing problems before his next appointment as I would be happy to see him sooner.  1. Moderate-severe COPD: Continuing patient on Spiriva & Symbicort. Repeat spirometry with DLCO next appointment. 2. GERD/dysphagia: Currently asymptomatic. Urged the patient to be evaluated if he develops any new symptoms. 3. Severe OSA with nocturnal hypoxia: Already scheduled for CPAP titration on Monday. The patient may have to have his mask switched after sinus surgery. 4. Allergic rhinitis/rhinosinusitis: ENT and plans for surgical correction early next year. 5. Health maintenance: Patient received influenza vaccine last week. He is inquiring with his prior doctor as to his Pneumovax/Prevnar status. 6. Follow-up: Patient to return to clinic in 6-8 weeks or sooner if needed.

## 2015-01-18 NOTE — Progress Notes (Signed)
PFT performed today. 

## 2015-01-18 NOTE — Patient Instructions (Signed)
1. Continue using the Symbicort & Spiriva as prescribed. 2. Try brushing her teeth and tongue after using her Symbicort twice daily. 3. Call me if you have any questions or concerns about her medications. 4. I will see back in 6-8 weeks or sooner if needed.

## 2015-01-19 NOTE — Progress Notes (Signed)
Walk & PFT reviewed.

## 2015-01-24 ENCOUNTER — Ambulatory Visit (HOSPITAL_BASED_OUTPATIENT_CLINIC_OR_DEPARTMENT_OTHER): Payer: Medicare Other | Attending: Pulmonary Disease

## 2015-01-24 VITALS — Ht 70.0 in | Wt 221.0 lb

## 2015-01-24 DIAGNOSIS — G4733 Obstructive sleep apnea (adult) (pediatric): Secondary | ICD-10-CM | POA: Diagnosis not present

## 2015-01-24 DIAGNOSIS — G4730 Sleep apnea, unspecified: Secondary | ICD-10-CM | POA: Diagnosis present

## 2015-01-25 DIAGNOSIS — N401 Enlarged prostate with lower urinary tract symptoms: Secondary | ICD-10-CM | POA: Diagnosis not present

## 2015-01-25 DIAGNOSIS — R3911 Hesitancy of micturition: Secondary | ICD-10-CM | POA: Diagnosis not present

## 2015-01-26 DIAGNOSIS — G4733 Obstructive sleep apnea (adult) (pediatric): Secondary | ICD-10-CM | POA: Diagnosis not present

## 2015-01-26 NOTE — Progress Notes (Signed)
Patient Name: Daniel [name], Daniel [name] Date: 01/24/2015 Gender: Male D.O.B: Mar 11, 1946 Age (years): 69 Referring Provider: Roslynn AmbleJennings E Nestor Height (inches): 70 Interpreting Physician: Coralyn HellingVineet Hetty Linhart MD, ABSM Weight (lbs): 220 RPSGT: Wylie HailDavis, Rico BMI: 32 MRN: [id number] Neck Size: 17.00  CLINICAL INFORMATION The patient is referred for a CPAP titration to treat sleep apnea.   Date of NPSG, Split Night or HST: 11/07/14  SLEEP [name] TECHNIQUE As per the AASM Manual for the Scoring of Sleep and Associated Events v2.3 (April 2016) with a hypopnea requiring 4% desaturations. The channels recorded and monitored were frontal, central and occipital EEG, electrooculogram (EOG), submentalis EMG (chin), nasal and oral airflow, thoracic and abdominal wall motion, anterior tibialis EMG, snore microphone, electrocardiogram, and pulse oximetry. Continuous positive airway pressure (CPAP) was initiated at the beginning of the [name] and titrated to treat sleep-disordered breathing.  MEDICATIONS Medications taken by the patient : reviewed in electronic medical record. Medications administered by patient during sleep [name] : No sleep medicine administered.  TECHNICIAN COMMENTS Comments added by technician: PATIENT TOLERATED CPAP WITHOUT DIFFICULTY  Comments added by scorer: N/A  RESPIRATORY PARAMETERS Optimal PAP Pressure (cm): 9 AHI at Optimal Pressure (/hr): 1.1 Overall Minimal O2 (%): 84.00 Supine % at Optimal Pressure (%): 4 Minimal O2 at Optimal Pressure (%): 85.00      SLEEP ARCHITECTURE The [name] was initiated at 11:06:10 PM and ended at 5:08:02 AM. Sleep onset time was 1.3 minutes and the sleep efficiency was 94.9%. The total sleep time was 343.6 minutes. The patient spent 6.69% of the night in stage N1 sleep, 65.22% in stage N2 sleep, 0.00% in stage N3 and 28.09% in REM.Stage REM latency was 12.5 minutes Wake after sleep onset was 17.0. Alpha intrusion was absent. Supine sleep was  27.15%.  CARDIAC DATA The 2 lead EKG demonstrated sinus rhythm. The mean heart rate was 58.77 beats per minute. Other EKG findings include: None.  LEG MOVEMENT DATA The total Periodic Limb Movements of Sleep (PLMS) were 93. The PLMS index was 16.24. A PLMS index of <15 is considered normal in adults.  IMPRESSIONS He did well with CPAP 9 cm H2O particularly in the non supine position.  He developed central respiratory events with higher CPAP settings.  DIAGNOSIS - Obstructive Sleep Apnea (327.23 [G47.33 ICD-10])  RECOMMENDATIONS I would recommend starting him on CPAP 9 cm H2O and monitor for his clinical response.   Coralyn HellingVineet Nazyia Gaugh, MD, ABSM Diplomate, American Board of Sleep Medicine 01/26/2015, 2:28 PM  NPI: [id number]

## 2015-01-27 ENCOUNTER — Telehealth: Payer: Self-pay | Admitting: *Deleted

## 2015-01-27 DIAGNOSIS — G4733 Obstructive sleep apnea (adult) (pediatric): Secondary | ICD-10-CM

## 2015-01-27 NOTE — Telephone Encounter (Signed)
lmomtcb x1 for pt Does he want to start CPAP prior to trip or afterwards?

## 2015-01-27 NOTE — Telephone Encounter (Signed)
Patient said he would like to start CPAP prior to trip.  He can be reached at [phone].  He said he will be outside and may leave a message.

## 2015-01-27 NOTE — Telephone Encounter (Signed)
-----   Message from Roslynn AmbleJennings E Nestor, MD sent at 01/27/2015  8:39 AM EDT ----- We need to put a Rx for his home CPAP at 9cm H20. Let me know the process. JN ----- Message -----    From: Coralyn HellingVineet Sood, MD    Sent: 01/26/2015   2:29 PM      To: Roslynn AmbleJennings E Nestor, MD  He did well with CPAP 9 cm H2O during titration study.

## 2015-01-27 NOTE — Telephone Encounter (Signed)
Called spoke with pt and he wants to start therapy prior to his trip. I have placed order for CPAP. FYI for Dr. Jamison NeighborNestor.

## 2015-02-07 DIAGNOSIS — Z79899 Other long term (current) drug therapy: Secondary | ICD-10-CM | POA: Diagnosis not present

## 2015-02-09 DIAGNOSIS — N400 Enlarged prostate without lower urinary tract symptoms: Secondary | ICD-10-CM | POA: Diagnosis not present

## 2015-02-09 DIAGNOSIS — M1990 Unspecified osteoarthritis, unspecified site: Secondary | ICD-10-CM | POA: Diagnosis not present

## 2015-02-09 DIAGNOSIS — B356 Tinea cruris: Secondary | ICD-10-CM | POA: Diagnosis not present

## 2015-02-09 DIAGNOSIS — Z23 Encounter for immunization: Secondary | ICD-10-CM | POA: Diagnosis not present

## 2015-02-09 DIAGNOSIS — I10 Essential (primary) hypertension: Secondary | ICD-10-CM | POA: Diagnosis not present

## 2015-03-09 ENCOUNTER — Telehealth: Payer: Self-pay | Admitting: Pulmonary Disease

## 2015-03-09 NOTE — Telephone Encounter (Signed)
Order sent to [email or web address]  Tobe SosSally E Ottinger

## 2015-03-09 NOTE — Telephone Encounter (Signed)
Order was placed 01/27/15 (in epic) for new CPAP start template. It does not state where order was sent. Pt reports he never heard from any DME and still has not been set up on CPAP. Please advise PCC's thanks

## 2015-03-09 NOTE — Telephone Encounter (Signed)
Well i do not see the order i see where you said you were putting it in but there is no order in his chart Daniel [name]

## 2015-03-09 NOTE — Telephone Encounter (Signed)
Spoke with Daniel [name] and order found. Will forward back to her

## 2015-03-12 DIAGNOSIS — R05 Cough: Secondary | ICD-10-CM | POA: Diagnosis not present

## 2015-03-12 DIAGNOSIS — J0100 Acute maxillary sinusitis, unspecified: Secondary | ICD-10-CM | POA: Diagnosis not present

## 2015-03-15 DIAGNOSIS — J329 Chronic sinusitis, unspecified: Secondary | ICD-10-CM | POA: Diagnosis not present

## 2015-03-21 ENCOUNTER — Ambulatory Visit (INDEPENDENT_AMBULATORY_CARE_PROVIDER_SITE_OTHER): Payer: Medicare Other | Admitting: Pulmonary Disease

## 2015-03-21 ENCOUNTER — Encounter: Payer: Self-pay | Admitting: Pulmonary Disease

## 2015-03-21 VITALS — BP 122/72 | HR 54 | Ht 69.5 in | Wt 212.0 lb

## 2015-03-21 DIAGNOSIS — J449 Chronic obstructive pulmonary disease, unspecified: Secondary | ICD-10-CM

## 2015-03-21 DIAGNOSIS — J309 Allergic rhinitis, unspecified: Secondary | ICD-10-CM

## 2015-03-21 DIAGNOSIS — G4733 Obstructive sleep apnea (adult) (pediatric): Secondary | ICD-10-CM

## 2015-03-21 LAB — PULMONARY FUNCTION TEST
DL/VA % pred: 53 %
DL/VA: 2.47 ml/min/mmHg/L
DLCO unc % pred: 45 %
DLCO unc: 14.56 ml/min/mmHg
FEF 25-75 PRE: 1.12 L/s
FEF2575-%Pred-Pre: 44 %
FEV1-%PRED-PRE: 73 %
FEV1-PRE: 2.39 L
FEV1FVC-%PRED-PRE: 87 %
FEV6-%Pred-Pre: 86 %
FEV6-PRE: 3.59 L
FEV6FVC-%PRED-PRE: 101 %
FVC-%Pred-Pre: 84 %
FVC-PRE: 3.71 L
PRE FEV1/FVC RATIO: 64 %
Pre FEV6/FVC Ratio: 97 %

## 2015-03-21 NOTE — Progress Notes (Signed)
Spirometry and dlco done today. 

## 2015-03-21 NOTE — Progress Notes (Signed)
Subjective:    Patient ID: Daniel [name], male    DOB: 22-Sep-1945, 69 y.o.   MRN: [id number]  C.C.:  Follow-up for Moderate-Severe COPD, Allergic Rhinitis, Severe OSA, & GERD/dyspepsia.  HPI Moderate-Severe COPD:  Previously on Advair & Stiolto. Switch to Spiriva & Symbicort previously. He reports his dyspnea has improved. He feels this is at least due in part to weight loss. Denies any coughing or wheezing. Reports compliance with his inhalers but does have problems remember to take his inhalers. Hasn't needed to use his rescue inhaler. No exacerbations since last appointment.  Allergic Rhinitis: IgE was elevated on serum testing but RAST panel was unrevealing. Dr. Pollyann Kennedy and plans for sinus surgery in January 2017. Recently has had some congestion lately that seems to be improving. He has been using Mucinex-DM. No post-nasal drainage.  Severe OSA: CPAP titration showed optimal pressure 9 cm H2O w/ full face mask. He is going to be set up with CPAP tomorrow.   GERD/dyspepsia: Occurred with Symbicort. Denies any reflux or dyspepsia recently.  Review of Systems  No chest pain or pressure. No fever, chills, or sweats.     Allergies  Allergen Reactions  . Carafate [Sucralfate] Hives   Current Outpatient Prescriptions on File Prior to Visit  Medication Sig Dispense Refill  . albuterol (PROAIR HFA) 108 (90 BASE) MCG/ACT inhaler Inhale 2 puffs into the lungs every 4 (four) hours as needed for wheezing or shortness of breath. 1 Inhaler 0  . aspirin 81 MG tablet Take 81 mg by mouth daily.    Marland Kitchen atenolol (TENORMIN) 25 MG tablet Take 25 mg by mouth daily.    . budesonide-formoterol (SYMBICORT) 160-4.5 MCG/ACT inhaler Inhale 2 puffs into the lungs 2 (two) times daily. 3 Inhaler 1  . celecoxib (CELEBREX) 200 MG capsule Take 200 mg by mouth daily.    . pravastatin (PRAVACHOL) 20 MG tablet Take 20 mg by mouth daily.    . tadalafil (CIALIS) 5 MG tablet Take 5 mg by mouth daily as needed for erectile  dysfunction.    . tamsulosin (FLOMAX) 0.4 MG CAPS capsule Take 0.4 mg by mouth daily. 2 tablets once daily    . Tiotropium Bromide Monohydrate (SPIRIVA RESPIMAT) 1.25 MCG/ACT AERS Inhale 2 puffs into the lungs daily. 1 Inhaler 0   No current facility-administered medications on file prior to visit.     Past Medical History  Diagnosis Date  . BPH (benign prostatic hyperplasia)   . COPD (chronic obstructive pulmonary disease) (HCC)   . Abdominal wall hernia   . Pneumonia 2004    "Staph" infection in left lung  . Erectile dysfunction   . Arthritis   . Hypertension   . GERD (gastroesophageal reflux disease)   . OSA (obstructive sleep apnea)   . Allergic rhinitis    Past Surgical History  Procedure Laterality Date  . Left knee surgery  1981  . Plastic surgery of forehead  1975  . Appendectomy  1966  . Hernia repair  2001, 2007, 2016  . Finger surgery Right     index finger   Family History  Problem Relation Age of Onset  . Colon cancer Paternal Grandfather   . Hernia Father   . Cancer Mother     breast, lung, liver  . Diabetes Maternal Grandfather   . Heart disease Paternal Grandmother    Social History   Social History  . Marital Status: Married    Spouse Name: N/A  . Number of Children: N/A  .  Years of Education: N/A   Occupational History  . retired    Social History Main Topics  . Smoking status: Current Some Day Smoker    Types: Cigars  . Smokeless tobacco: None     Comment: only smoke a cigar when grilling out  . Alcohol Use: 0.0 oz/week    0 Standard drinks or equivalent per week  . Drug Use: None  . Sexual Activity: Not Asked   Other Topics Concern  . None   Social History Narrative   Quit smoking cigarettes in 2001. He smoked for 30 years at a peak rate of 2ppd. Has 2 dogs currently at home. No prior bird exposure. He is retired from [employer] in 1987. He was in the infantry. [place] 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 He enjoys hunting & fishing. He recently moved to [place] from [place].     Objective:   Physical Exam BP 122/72 mmHg  Pulse 54  Ht 5' 9.5" (1.765 m)  Wt 212 lb (96.163 kg)  BMI 30.87 kg/m2  SpO2 98% General: Obese Caucasian male but with improving weight. Alert. No distress. Integument:  Warm & dry. No rash on exposed skin. HEENT:  Bilateral nasal turbinate swelling unchanged. Moist mucous membranes. No scleral injection. Cardiovascular:  Regular rate. Regular rhythm. No edema.  Pulmonary:  Normal work of breathing on room air. Clear bilaterally to auscultation Abdomen: Soft. Normal bowel sounds. Nontender. Protuberant. Musculoskeletal:  Normal bulk and tone. Hand grip strength 5/5 bilaterally. No joint deformity or effusion appreciated.  PFT 03/21/15: FVC 3.71 L (84%) FEV1 2.39 L (73%) FEV1/FVC 0.64 FEF 25-75 1.12 L (44%)                                                                                                          DLCO uncorrected 45% 01/18/15: FVC 3.70 L (82%) FEV1 2.41 L (72%) FEV1/FVC 0.65 FEF 25-75 1.19 L (46%) negative bronchodilator response TLC 6.21 L (80%) RV 90% ERV 96% DLCO uncorrected 41% 11/08/14: FVC 3.42 L (75%) FEV1 1.90 L (57%) FEV1/FVC 0.56 FEF 25-75 0.71 L (27%) positive bronchodilator response TLC 6.26 L (89%) RV 180% ERV 68% DLCO uncorrected 42%  6MWT 01/18/15:  Walked 528 meters / Baseline Sat 97% on RA / Nadir Sat 92% on RA 11/08/14: Walked 492 meters / Baseline Sat 94% on RA / Nadir Sat 90% on RA  POLYSOMNOGRAM 11/08/14:  Severe OSA with AHI 35.5 events/hour. Central sleep apnea 0.0 events/hour. Lowest saturation 81% during study. No cardiac abnormalities were noted. Mild periodic limb movement of sleep occurred during study without significant arousal.  IMAGING CXR PA/LAT 10/25/14 (previously reviewed by me): Mild hyperinflation with flattening of the diaphragms. No parenchymal nodule or opacity appreciated. No pleural effusion or  thickening appreciated. Heart normal in size. Mediastinum normal in contour.  CARDIAC TTE (12/10/14): LV normal in size. EF 65-70%. No regional wall motion abnormalities. Grade 1 diastolic dysfunction. LA & RA mildly dilated. RV normal in size and function. Unable to  estimate pulmonary artery systolic pressure. No aortic stenosis or regurgitation. No mitral stenosis or regurgitation. Trivial pulmonic regurgitation. Trivial tricuspid regurgitation. No pericardial effusion. Dilated aortic root measuring 4.3 cm & ascending aorta measuring 4.1 cm.  LABS 12/06/14 Pro-BNP: 68.0 Alpha-1 antitrypsin: (MM) 133   10/25/14 Alpha-1 antitrypsin: 138 IgE: 376 RAST panel: Candida albicans 0.23 (class 0/1)    Assessment & Plan:  69 year old male with underlying moderate-severe COPD, severe OSA with nocturnal hypoxia, & allergic rhinitis/rhinosinusitis. Spirometry remains stable on his current inhaler regimen. Patient continues to have no symptoms from his underlying GERD. He is scheduled to initiate CPAP therapy with a full facemask tomorrow and I did caution him regarding the potential for mask leak with his new facial hair.   1. Moderate-severe COPD: No change to his current inhaler regimen of Spiriva & Symbicort.  2. Severe OSA with Nocturnal Hypoxia:  Plan to initiate CPAP at 9 cm H2O with full face mask. 3. Allergic rhinitis/rhinosinusitis: ENT and plans for surgical correction early next year. No new medications. 4. GERD/dysphagia: Currently asymptomatic off medication. 5. Health maintenance: Patient reports he did previously receive the pneumonia vaccine. Recently received Prevnar & influenza vaccines. 6. Follow-up: Patient to return to clinic in 1-2 months for follow-up of his OSA on CPAP therapy.

## 2015-03-21 NOTE — Patient Instructions (Signed)
1. Continue using your inhalers as prescribed. 2. Let me know if you have any problems with your CPAP. 3. You may have to shave your beard so your CPAP mask does not leak. 4. I will see you back in 1-2 months but please call if you have any breathing problems before then.

## 2015-03-24 ENCOUNTER — Telehealth: Payer: Self-pay | Admitting: Pulmonary Disease

## 2015-03-24 DIAGNOSIS — G4734 Idiopathic sleep related nonobstructive alveolar hypoventilation: Secondary | ICD-10-CM

## 2015-03-24 DIAGNOSIS — R011 Cardiac murmur, unspecified: Secondary | ICD-10-CM

## 2015-03-24 DIAGNOSIS — R0600 Dyspnea, unspecified: Secondary | ICD-10-CM

## 2015-03-24 DIAGNOSIS — G4733 Obstructive sleep apnea (adult) (pediatric): Secondary | ICD-10-CM

## 2015-03-24 DIAGNOSIS — J449 Chronic obstructive pulmonary disease, unspecified: Secondary | ICD-10-CM

## 2015-03-24 DIAGNOSIS — J309 Allergic rhinitis, unspecified: Secondary | ICD-10-CM

## 2015-03-24 MED ORDER — ALBUTEROL SULFATE HFA 108 (90 BASE) MCG/ACT IN AERS: 2.0000 | INHALATION_SPRAY | RESPIRATORY_TRACT | Status: DC | PRN

## 2015-03-24 MED ORDER — TIOTROPIUM BROMIDE MONOHYDRATE 1.25 MCG/ACT IN AERS: 2.0000 | INHALATION_SPRAY | Freq: Every day | RESPIRATORY_TRACT | Status: DC

## 2015-03-24 NOTE — Telephone Encounter (Signed)
Rxs that were requested were sent to [facility]

## 2015-04-13 DIAGNOSIS — L300 Nummular dermatitis: Secondary | ICD-10-CM | POA: Diagnosis not present

## 2015-04-13 DIAGNOSIS — L299 Pruritus, unspecified: Secondary | ICD-10-CM | POA: Diagnosis not present

## 2015-04-29 DIAGNOSIS — N401 Enlarged prostate with lower urinary tract symptoms: Secondary | ICD-10-CM | POA: Diagnosis not present

## 2015-04-29 DIAGNOSIS — R310 Gross hematuria: Secondary | ICD-10-CM | POA: Diagnosis not present

## 2015-05-03 DIAGNOSIS — I700 Atherosclerosis of aorta: Secondary | ICD-10-CM | POA: Diagnosis not present

## 2015-05-03 DIAGNOSIS — I714 Abdominal aortic aneurysm, without rupture: Secondary | ICD-10-CM | POA: Diagnosis not present

## 2015-05-03 DIAGNOSIS — R310 Gross hematuria: Secondary | ICD-10-CM | POA: Diagnosis not present

## 2015-05-03 DIAGNOSIS — N281 Cyst of kidney, acquired: Secondary | ICD-10-CM | POA: Diagnosis not present

## 2015-05-03 DIAGNOSIS — N210 Calculus in bladder: Secondary | ICD-10-CM | POA: Diagnosis not present

## 2015-05-09 ENCOUNTER — Ambulatory Visit (INDEPENDENT_AMBULATORY_CARE_PROVIDER_SITE_OTHER): Payer: Medicare Other | Admitting: Pulmonary Disease

## 2015-05-09 VITALS — BP 128/88 | HR 68 | Ht 69.5 in | Wt 216.8 lb

## 2015-05-09 DIAGNOSIS — G4733 Obstructive sleep apnea (adult) (pediatric): Secondary | ICD-10-CM

## 2015-05-09 DIAGNOSIS — K219 Gastro-esophageal reflux disease without esophagitis: Secondary | ICD-10-CM | POA: Diagnosis not present

## 2015-05-09 DIAGNOSIS — Z9989 Dependence on other enabling machines and devices: Principal | ICD-10-CM

## 2015-05-09 DIAGNOSIS — J3089 Other allergic rhinitis: Secondary | ICD-10-CM | POA: Diagnosis not present

## 2015-05-09 DIAGNOSIS — J449 Chronic obstructive pulmonary disease, unspecified: Secondary | ICD-10-CM

## 2015-05-09 NOTE — Progress Notes (Signed)
Subjective:    Patient ID: Daniel [name], male    DOB: 05-16-1945, 69 y.o.   MRN: [id number]  C.C.:  Follow-up for Moderate-Severe COPD, Allergic Rhinitis, Severe OSA, & GERD/dyspepsia.  HPI Moderate-Severe COPD:  Previously on Advair & Stiolto. Switched to [employer] previously. Dyspnea has improved with weight loss.  No coughing or wheezing. Dyspnea going up stairs that recovers quickly. Compliant with Spiriva & Symbicort.   Allergic Rhinitis: IgE was elevated on serum testing but RAST panel was unrevealing. Dr. Pollyann Kennedy and plans for sinus surgery in January 2017. Reports his sinus congestion & pressure has dramatically improved with weight loss.  Severe OSA: CPAP titration showed optimal pressure 9 cm H2O w/ full face mask. Reports he still falls asleep watching TV. He reports he uses his CPAP at least 4 hours a night. He does have frequent mask leaks and finds the mask cumbersome. He is using humidification but reports some dry mouth. Wife reports he tosses & turns less and sleeps more soundly with less snoring. He has not been napping during the day. Reports better quality of sleep at night.   GERD/dyspepsia: Occurred with Symbicort. Denies any reflux or dyspepsia recently.  Review of Systems  No chest pain or pressure. Reports intermittently left hand tremor. No weakness or numbness.      Allergies  Allergen Reactions  . Carafate [Sucralfate] Hives   Current Outpatient Prescriptions on File Prior to Visit  Medication Sig Dispense Refill  . albuterol (PROAIR HFA) 108 (90 BASE) MCG/ACT inhaler Inhale 2 puffs into the lungs every 4 (four) hours as needed for wheezing or shortness of breath. 3 Inhaler 0  . aspirin 81 MG tablet Take 81 mg by mouth daily.    Marland Kitchen atenolol (TENORMIN) 25 MG tablet Take 25 mg by mouth daily.    . budesonide-formoterol (SYMBICORT) 160-4.5 MCG/ACT inhaler Inhale 2 puffs into the lungs 2 (two) times daily. 3 Inhaler 1  . celecoxib (CELEBREX) 200 MG  capsule Take 200 mg by mouth daily.    . pravastatin (PRAVACHOL) 20 MG tablet Take 20 mg by mouth daily.    . tadalafil (CIALIS) 5 MG tablet Take 5 mg by mouth daily as needed for erectile dysfunction.    . tamsulosin (FLOMAX) 0.4 MG CAPS capsule Take 0.4 mg by mouth daily. 2 tablets once daily    . Tiotropium Bromide Monohydrate (SPIRIVA RESPIMAT) 1.25 MCG/ACT AERS Inhale 2 puffs into the lungs daily. 3 Inhaler 1   No current facility-administered medications on file prior to visit.     Past Medical History  Diagnosis Date  . BPH (benign prostatic hyperplasia)   . COPD (chronic obstructive pulmonary disease) (HCC)   . Abdominal wall hernia   . Pneumonia 2004    "Staph" infection in left lung  . Erectile dysfunction   . Arthritis   . Hypertension   . GERD (gastroesophageal reflux disease)   . OSA (obstructive sleep apnea)   . Allergic rhinitis    Past Surgical History  Procedure Laterality Date  . Left knee surgery  1981  . Plastic surgery of forehead  1975  . Appendectomy  1966  . Hernia repair  2001, 2007, 2016  . Finger surgery Right     index finger   Family History  Problem Relation Age of Onset  . Colon cancer Paternal Grandfather   . Hernia Father   . Cancer Mother     breast, lung, liver  . Diabetes Maternal Grandfather   .  Heart disease Paternal Grandmother    Social History   Social History  . Marital Status: Married    Spouse Name: N/A  . Number of Children: N/A  . Years of Education: N/A   Occupational History  . retired    Social History Main Topics  . Smoking status: Current Some Day Smoker    Types: Cigars  . Smokeless tobacco: Not on file     Comment: only smoke a cigar when grilling out  . Alcohol Use: 0.0 oz/week    0 Standard drinks or equivalent per week  . Drug Use: Not on file  . Sexual Activity: Not on file   Other Topics Concern  . Not on file   Social History Narrative   Quit smoking cigarettes in 2001. He smoked for 30 years  at a peak rate of 2ppd. Has 2 dogs currently at home. No prior bird exposure. 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 He recently moved to [place] from [place], [place].     Objective:   Physical Exam BP 128/88 mmHg  Pulse 68  Ht 5' 9.5" (1.765 m)  Wt 216 lb 12.8 oz (98.34 kg)  BMI 31.57 kg/m2  SpO2 97% General: Caucasian male. Alert. No distress. Integument:  Warm & dry. No rash on exposed skin. HEENT: Bilateral nasal turbinate swelling right greater than left. No oral ulcers. Moist mucosa. Cardiovascular:  Regular rate. Regular rhythm. No edema.  Pulmonary:  Normal work of breathing on room air. Clear bilaterally to auscultation Abdomen: Soft. Normal bowel sounds. Nontender. Protuberant. Musculoskeletal:  Normal bulk and tone. No joint deformity or effusion appreciated.  PFT 03/21/15: FVC 3.71 L (84%) FEV1 2.39 L (73%) FEV1/FVC 0.64 FEF 25-75 1.12 L (44%)                                                                                                                         DLCO uncorrected 45% 01/18/15: FVC 3.70 L (82%) FEV1 2.41 L (72%) FEV1/FVC 0.65 FEF 25-75 1.19 L (46%) negative bronchodilator response TLC 6.21 L (80%) RV 90% ERV 96% DLCO uncorrected 41% 11/08/14: FVC 3.42 L (75%) FEV1 1.90 L (57%) FEV1/FVC 0.56 FEF 25-75 0.71 L (27%) positive bronchodilator response TLC 6.26 L (89%) RV 180% ERV 68% DLCO uncorrected 42%  01/18/15:  Walked 528 meters / Baseline Sat 97% on RA / Nadir Sat 92% on RA 11/08/14: Walked 492 meters / Baseline Sat 94% on RA / Nadir Sat 90% on RA  CPAP COMPLIANCE 04/09/15-05/08/15:  67% usage (20 days) >/= 4 hours. 9cmH2O. 68.7 leaks (95th percentile) & Residual AHI 6.1 events/hr. Average 4 hours 31 minutes per night.  POLYSOMNOGRAM 11/08/14:  Severe OSA with AHI 35.5 events/hour. Central sleep apnea 0.0 events/hour. Lowest  saturation 81% during study. No cardiac abnormalities were noted. Mild periodic limb movement  of sleep occurred during study without significant arousal.  IMAGING CXR PA/LAT 10/25/14 (previously reviewed by me): Mild hyperinflation with flattening of the diaphragms. No parenchymal nodule or opacity appreciated. No pleural effusion or thickening appreciated. Heart normal in size. Mediastinum normal in contour.  CARDIAC TTE (12/10/14): LV normal in size. EF 65-70%. No regional wall motion abnormalities. Grade 1 diastolic dysfunction. LA & RA mildly dilated. RV normal in size and function. Unable to estimate pulmonary artery systolic pressure. No aortic stenosis or regurgitation. No mitral stenosis or regurgitation. Trivial pulmonic regurgitation. Trivial tricuspid regurgitation. No pericardial effusion. Dilated aortic root measuring 4.3 cm & ascending aorta measuring 4.1 cm.  LABS 12/06/14 Pro-BNP: 68.0 Alpha-1 antitrypsin: (MM) 133   10/25/14 Alpha-1 antitrypsin: 138 IgE: 376 RAST panel: Candida albicans 0.23 (class 0/1)    Assessment & Plan:  70 year old male with underlying moderate-severe COPD, severe OSA with nocturnal hypoxia, & allergic rhinitis/rhinosinusitis. Patient is having some troubles with mask leaking. This is evident on his compliance download as are his frequent leaks from his mask. He is truly trying to be compliant but falling asleep at night in his chair. He is using it every night since he got it however. He has improved sleep quality both by he and his wife's report. He denies any new problems or exacerbations from his COPD. Denies any new reflux. He is contemplating whether he wants nasal surgery given his improvement in sinus congestion since he lost weight.  1. Severe OSA with Nocturnal Hypoxia:  Continue CPAP. Trying using while watching TV & also different nasal masks. 2. Moderate-severe COPD: Continue Symbicort & Spiriva. No changes. Spirometry with bronchodilator  challenge at next appointment.  3. Allergic rhinitis/rhinosinusitis: Improved symptoms. Holding on surgery for now. 4. GERD/dysphagia: Currently asymptomatic off medication. 5. Follow-up: Patient to return to clinic in 3 months or sooner if needed.

## 2015-05-09 NOTE — Patient Instructions (Signed)
1. Continue using your CPAP. 2. Trying wearing it while you're watching TV at night. 3. Inquire about additional types of head gear & nasal masks with your supplier. 4. I will see you back in 3 months or sooner if needed.  TESTS ORDERED: 1. Spirometry with bronchodilator challenge at next appointment.

## 2015-06-24 ENCOUNTER — Other Ambulatory Visit: Payer: Self-pay | Admitting: Pulmonary Disease

## 2015-08-05 ENCOUNTER — Ambulatory Visit (INDEPENDENT_AMBULATORY_CARE_PROVIDER_SITE_OTHER): Payer: Medicare Other | Admitting: Pulmonary Disease

## 2015-08-05 ENCOUNTER — Encounter: Payer: Self-pay | Admitting: Pulmonary Disease

## 2015-08-05 VITALS — BP 126/76 | HR 60 | Ht 69.5 in | Wt 221.0 lb

## 2015-08-05 DIAGNOSIS — Z9989 Dependence on other enabling machines and devices: Secondary | ICD-10-CM

## 2015-08-05 DIAGNOSIS — G4733 Obstructive sleep apnea (adult) (pediatric): Secondary | ICD-10-CM | POA: Diagnosis not present

## 2015-08-05 DIAGNOSIS — J449 Chronic obstructive pulmonary disease, unspecified: Secondary | ICD-10-CM

## 2015-08-05 DIAGNOSIS — J302 Other seasonal allergic rhinitis: Secondary | ICD-10-CM | POA: Diagnosis not present

## 2015-08-05 DIAGNOSIS — K219 Gastro-esophageal reflux disease without esophagitis: Secondary | ICD-10-CM

## 2015-08-05 LAB — PULMONARY FUNCTION TEST
FEF 25-75 Post: 1.11 L/s
FEF 25-75 Pre: 0.85 L/s
FEF2575-%Change-Post: 30 %
FEF2575-%Pred-Post: 44 %
FEF2575-%Pred-Pre: 34 %
FEV1-%Change-Post: 11 %
FEV1-%Pred-Post: 68 %
FEV1-%Pred-Pre: 61 %
FEV1-Post: 2.2 L
FEV1-Pre: 1.98 L
FEV1FVC-%Change-Post: 1 %
FEV1FVC-%Pred-Pre: 83 %
FEV6-%Change-Post: 10 %
FEV6-%Pred-Post: 81 %
FEV6-%Pred-Pre: 73 %
FEV6-Post: 3.36 L
FEV6-Pre: 3.04 L
FEV6FVC-%Change-Post: 1 %
FEV6FVC-%Pred-Post: 101 %
FEV6FVC-%Pred-Pre: 100 %
FVC-%Change-Post: 9 %
FVC-%Pred-Post: 79 %
FVC-%Pred-Pre: 72 %
FVC-Post: 3.49 L
FVC-Pre: 3.19 L
Post FEV1/FVC ratio: 63 %
Post FEV6/FVC ratio: 96 %
Pre FEV1/FVC ratio: 62 %
Pre FEV6/FVC Ratio: 95 %

## 2015-08-05 MED ORDER — MUELLER ADJUSTABLE BACK BRACE MISC: 1.0000 | Freq: Every day | Status: AC

## 2015-08-05 NOTE — Patient Instructions (Signed)
   Continue using your Spiriva.  Try using Breo in place of Symbicort. Inhale 1 puff daily & remember to rinse, gargle, and spit to prevent thrush. Call my office if you would prefer to use this inhaler permanently so we can get you a prescription.  We will do a breathing test at her next appointment.  I will see you back in 6 months or sooner if needed  TESTS ORDERED: 1. Spirometry with bronchodilator challenge at next appointment

## 2015-08-05 NOTE — Progress Notes (Signed)
Spirometry pre and post done today. 

## 2015-08-05 NOTE — Progress Notes (Signed)
Subjective:    Patient ID: Daniel [name], male    DOB: 03/24/1946, 70 y.o.   MRN: [id number]  C.C.:  Follow-up for Moderate-Severe COPD, Allergic Rhinitis, Severe OSA, & GERD/dyspepsia.  HPI Moderate-Severe COPD:  Admits he is not religious about his inhalers. He does sometimes forget his Symbicort. Denies any coughing or wheezing. No increase in his baseline dyspnea.  Allergic Rhinitis: Follows w/ Dr. Pollyann Kennedy and holding on surgical plans. He denies any sinus congestion, pressure, or pain.   Severe OSA: CPAP titration showed optimal pressure 9 cm H2O w/ full face mask. Compliance download shows excellent usage. Wife reports he is sleeping more peacefully. He has no snoring.   GERD/dyspepsia: Occurred with Symbicort. No reflux or dyspepsia.   Review of Systems  No chest pain or pressure. No fever, chills, or sweats. Patient c/o chronic back pain and has mis-placed his back brace. Continuing to have back pain.     Allergies  Allergen Reactions  . Carafate [Sucralfate] Hives   Current Outpatient Prescriptions on File Prior to Visit  Medication Sig Dispense Refill  . albuterol (PROAIR HFA) 108 (90 BASE) MCG/ACT inhaler Inhale 2 puffs into the lungs every 4 (four) hours as needed for wheezing or shortness of breath. 3 Inhaler 0  . aspirin 81 MG tablet Take 81 mg by mouth daily.    Marland Kitchen atenolol (TENORMIN) 25 MG tablet Take 25 mg by mouth daily.    . celecoxib (CELEBREX) 200 MG capsule Take 200 mg by mouth daily.    . pravastatin (PRAVACHOL) 20 MG tablet Take 20 mg by mouth daily.    . SYMBICORT 160-4.5 MCG/ACT inhaler USE 2 INHALATIONS TWICE A DAY 30.6 g 0  . tadalafil (CIALIS) 5 MG tablet Take 5 mg by mouth daily as needed for erectile dysfunction.    . tamsulosin (FLOMAX) 0.4 MG CAPS capsule 2 tablets once daily    . Tiotropium Bromide Monohydrate (SPIRIVA RESPIMAT) 1.25 MCG/ACT AERS Inhale 2 puffs into the lungs daily. 3 Inhaler 1   No current facility-administered medications on file  prior to visit.     Past Medical History  Diagnosis Date  . BPH (benign prostatic hyperplasia)   . COPD (chronic obstructive pulmonary disease) (HCC)   . Abdominal wall hernia   . Pneumonia 2004    "Staph" infection in left lung  . Erectile dysfunction   . Arthritis   . Hypertension   . GERD (gastroesophageal reflux disease)   . OSA (obstructive sleep apnea)   . Allergic rhinitis    Past Surgical History  Procedure Laterality Date  . Left knee surgery  1981  . Plastic surgery of forehead  1975  . Appendectomy  1966  . Hernia repair  2001, 2007, 2016  . Finger surgery Right     index finger   Family History  Problem Relation Age of Onset  . Colon cancer Paternal Grandfather   . Hernia Father   . Cancer Mother     breast, lung, liver  . Diabetes Maternal Grandfather   . Heart disease Paternal Grandmother    Social History   Social History  . Marital Status: Married    Spouse Name: N/A  . Number of Children: N/A  . Years of Education: N/A   Occupational History  . retired    Social History Main Topics  . Smoking status: Current Some Day Smoker    Types: Cigars  . Smokeless tobacco: None     Comment: only smoke a cigar when  grilling out  . Alcohol Use: 0.0 oz/week    0 Standard drinks or equivalent per week  . Drug Use: None  . Sexual Activity: Not Asked   Other Topics Concern  . None   Social History Narrative   Quit smoking cigarettes in 2001. He smoked for 30 years at a peak rate of 2ppd. Has 2 dogs currently at home. No prior bird exposure. He is retired from [employer] in 1987. He was in the infantry. 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 He enjoys hunting & fishing. He recently moved to [place] from [place]     Objective:   Physical Exam BP 126/76 mmHg  Pulse 60  Ht 5' 9.5" (1.765 m)  Wt 221 lb (100.245 kg)  BMI 32.18 kg/m2  SpO2 99% General: Caucasian male. Obese. No acute distress. Integument:   Warm & dry. No rash on exposed skin. HEENT: Unchanged moderate bilateral nasal turbinate swelling. No oral ulcers. Moist mucous membranes. Cardiovascular:  Regular rate. Regular rhythm. No edema.  Pulmonary:  Clear to auscultation bilaterally. Normal work of breathing on room air. Speaking in complete sentences. Abdomen: Soft. Normal bowel sounds. Protuberant. Musculoskeletal:  Normal bulk and tone. No joint deformity or effusion appreciated.  PFT 08/05/15: FVC 3.19 L (72%) FEV1 1.98 L (61%) FEV1/FVC 0.62 FEF 25-75 0.85 L (34%) negative bronchodilator response 03/21/15: FVC 3.71 L (84%) FEV1 2.39 L (73%) FEV1/FVC 0.64 FEF 25-75 1.12 L (44%)                                                                                                                         DLCO uncorrected 45% 01/18/15: FVC 3.70 L (82%) FEV1 2.41 L (72%) FEV1/FVC 0.65 FEF 25-75 1.19 L (46%) negative bronchodilator response TLC 6.21 L (80%) RV 90% ERV 96% DLCO uncorrected 41% 11/08/14: FVC 3.42 L (75%) FEV1 1.90 L (57%) FEV1/FVC 0.56 FEF 25-75 0.71 L (27%) positive bronchodilator response TLC 6.26 L (89%) RV 180% ERV 68% DLCO uncorrected 42%  6MWT 01/18/15:  Walked 528 meters / Baseline Sat 97% on RA / Nadir Sat 92% on RA 11/08/14: Walked 492 meters / Baseline Sat 94% on RA / Nadir Sat 90% on RA  CPAP COMPLIANCE 07/04/15-08/02/15:  90% usage >/= 4 hours. 9cmH2O. AHI 2.3.  04/09/15-05/08/15:  67% usage (20 days) >/= 4 hours. 9cmH2O. 68.7 leaks (95th percentile) & Residual AHI 6.1 events/hr. Average 4 hours 31 minutes per night.  POLYSOMNOGRAM 11/08/14:  Severe OSA with AHI 35.5 events/hour. Central sleep apnea 0.0 events/hour. Lowest saturation 81% during study. No cardiac abnormalities were noted. Mild periodic limb movement of sleep occurred during study without significant arousal.  IMAGING CXR PA/LAT 10/25/14 (previously reviewed by me): Mild hyperinflation with flattening of the diaphragms. No parenchymal nodule or  opacity appreciated. No pleural effusion or thickening appreciated. Heart normal in size. Mediastinum normal in contour.  CARDIAC TTE (12/10/14): LV normal in size. EF 65-70%.  No regional wall motion abnormalities. Grade 1 diastolic dysfunction. LA & RA mildly dilated. RV normal in size and function. Unable to estimate pulmonary artery systolic pressure. No aortic stenosis or regurgitation. No mitral stenosis or regurgitation. Trivial pulmonic regurgitation. Trivial tricuspid regurgitation. No pericardial effusion. Dilated aortic root measuring 4.3 cm & ascending aorta measuring 4.1 cm.  LABS 12/06/14 Pro-BNP: 68.0 Alpha-1 antitrypsin: (MM) 133   10/25/14 Alpha-1 antitrypsin: 138 IgE: 376 RAST panel: Candida albicans 0.23 (class 0/1)    Assessment & Plan:  70 year old male with underlying moderate-severe COPD, severe OSA with nocturnal hypoxia, & allergic rhinitis/rhinosinusitis. Compliance and comfort on CPAP has improved significantly with nasal pillows. Patient encouraged to continue use of his home device. Remains asymptomatic with regards to COPD but his spirometry has significantly worsened since previous testing without a significant bronchodilator response. I believe that improved adherence to his medication regimen would be of great benefit and switching to once daily medication may be necessary. I am giving him samples of Breo today to use in place of Symbicort to see if this medication is better tolerated and easier to use for him. I instructed the patient to contact my office for any new breathing problems before his next appointment or questions.  1. Moderate-severe COPD: Try using Breo in place of Symbicort. Patient will contact my office if he wishes to continue this medication. Repeat spirometry at next appointment to ensure maximal bronchodilatation. 2. Severe OSA with Nocturnal Hypoxia:  Continue CPAP with nasal pillows. Good compliance. 3. Allergic rhinitis/rhinosinusitis:  Currently asymptomatic. Continuing to hold off on surgery. 4. GERD/dysphagia: Asymptomatic. No new medications at this time. 5. Chronic Back Pain:  Giving patient a prescription for his Mueller Back Brace. 6. Health maintenance: Influenza vaccine October 2016 & Prevnar October 2015. 7. Follow-up: Patient to return to clinic in 6 months or sooner if needed.  Donna Christen Jamison Neighbor, M.D. [facility] Pager:  [phone] After 3pm or if no response, call [phone] 11:37 AM 08/05/2015

## 2015-08-05 NOTE — Addendum Note (Signed)
Addended by: Sheran LuzEAST, Quantina Dershem K on: 08/05/2015 11:47 AM   Modules accepted: Orders

## 2015-08-17 ENCOUNTER — Telehealth: Payer: Self-pay | Admitting: Pulmonary Disease

## 2015-08-17 MED ORDER — FLUTICASONE FUROATE-VILANTEROL 100-25 MCG/INH IN AEPB: 1.0000 | INHALATION_SPRAY | Freq: Every day | RESPIRATORY_TRACT | Status: AC

## 2015-08-17 NOTE — Telephone Encounter (Signed)
LM with patient wife that Rx is being filled to [facility] as requested per patient - verified strength as Breo . Nothing further needed.

## 2015-08-22 ENCOUNTER — Encounter: Payer: Self-pay | Admitting: Pulmonary Disease

## 2015-09-14 DIAGNOSIS — R339 Retention of urine, unspecified: Secondary | ICD-10-CM | POA: Diagnosis not present

## 2015-09-14 DIAGNOSIS — N401 Enlarged prostate with lower urinary tract symptoms: Secondary | ICD-10-CM | POA: Diagnosis not present

## 2015-10-14 DIAGNOSIS — M1990 Unspecified osteoarthritis, unspecified site: Secondary | ICD-10-CM | POA: Diagnosis not present

## 2015-10-14 DIAGNOSIS — Z9181 History of falling: Secondary | ICD-10-CM | POA: Diagnosis not present

## 2015-10-14 DIAGNOSIS — I10 Essential (primary) hypertension: Secondary | ICD-10-CM | POA: Diagnosis not present

## 2015-10-14 DIAGNOSIS — Z0000 Encounter for general adult medical examination without abnormal findings: Secondary | ICD-10-CM | POA: Diagnosis not present

## 2015-10-28 ENCOUNTER — Other Ambulatory Visit: Payer: Self-pay | Admitting: Pulmonary Disease

## 2015-11-01 DIAGNOSIS — L299 Pruritus, unspecified: Secondary | ICD-10-CM | POA: Diagnosis not present

## 2015-11-01 DIAGNOSIS — L300 Nummular dermatitis: Secondary | ICD-10-CM | POA: Diagnosis not present

## 2015-12-15 DIAGNOSIS — I10 Essential (primary) hypertension: Secondary | ICD-10-CM | POA: Diagnosis not present

## 2015-12-15 DIAGNOSIS — E669 Obesity, unspecified: Secondary | ICD-10-CM | POA: Diagnosis not present

## 2015-12-15 DIAGNOSIS — N400 Enlarged prostate without lower urinary tract symptoms: Secondary | ICD-10-CM | POA: Diagnosis not present

## 2015-12-15 DIAGNOSIS — R600 Localized edema: Secondary | ICD-10-CM | POA: Diagnosis not present

## 2015-12-28 DIAGNOSIS — L299 Pruritus, unspecified: Secondary | ICD-10-CM | POA: Diagnosis not present

## 2015-12-28 DIAGNOSIS — L818 Other specified disorders of pigmentation: Secondary | ICD-10-CM | POA: Diagnosis not present

## 2015-12-28 DIAGNOSIS — L559 Sunburn, unspecified: Secondary | ICD-10-CM | POA: Diagnosis not present

## 2015-12-28 DIAGNOSIS — L821 Other seborrheic keratosis: Secondary | ICD-10-CM | POA: Diagnosis not present

## 2015-12-28 DIAGNOSIS — L84 Corns and callosities: Secondary | ICD-10-CM | POA: Diagnosis not present

## 2016-01-03 DIAGNOSIS — R109 Unspecified abdominal pain: Secondary | ICD-10-CM | POA: Diagnosis not present

## 2016-01-03 DIAGNOSIS — M25473 Effusion, unspecified ankle: Secondary | ICD-10-CM | POA: Diagnosis not present

## 2016-01-03 DIAGNOSIS — Z23 Encounter for immunization: Secondary | ICD-10-CM | POA: Diagnosis not present

## 2016-01-04 DIAGNOSIS — G4733 Obstructive sleep apnea (adult) (pediatric): Secondary | ICD-10-CM | POA: Diagnosis not present

## 2016-01-04 DIAGNOSIS — J449 Chronic obstructive pulmonary disease, unspecified: Secondary | ICD-10-CM | POA: Diagnosis not present

## 2016-01-04 DIAGNOSIS — Z87891 Personal history of nicotine dependence: Secondary | ICD-10-CM | POA: Diagnosis not present

## 2016-01-05 DIAGNOSIS — N401 Enlarged prostate with lower urinary tract symptoms: Secondary | ICD-10-CM | POA: Diagnosis not present

## 2016-01-05 DIAGNOSIS — R3915 Urgency of urination: Secondary | ICD-10-CM | POA: Diagnosis not present

## 2016-01-05 DIAGNOSIS — N390 Urinary tract infection, site not specified: Secondary | ICD-10-CM | POA: Diagnosis not present

## 2016-01-09 DIAGNOSIS — I77811 Abdominal aortic ectasia: Secondary | ICD-10-CM | POA: Diagnosis not present

## 2016-01-09 DIAGNOSIS — Z9889 Other specified postprocedural states: Secondary | ICD-10-CM | POA: Diagnosis not present

## 2016-01-09 DIAGNOSIS — K5730 Diverticulosis of large intestine without perforation or abscess without bleeding: Secondary | ICD-10-CM | POA: Diagnosis not present

## 2016-01-09 DIAGNOSIS — R109 Unspecified abdominal pain: Secondary | ICD-10-CM | POA: Diagnosis not present

## 2016-01-09 DIAGNOSIS — N281 Cyst of kidney, acquired: Secondary | ICD-10-CM | POA: Diagnosis not present

## 2016-01-13 DIAGNOSIS — I714 Abdominal aortic aneurysm, without rupture: Secondary | ICD-10-CM | POA: Diagnosis not present

## 2016-01-27 ENCOUNTER — Telehealth: Payer: Self-pay | Admitting: Pulmonary Disease

## 2016-01-27 NOTE — Telephone Encounter (Signed)
Rec'd request for records from [facility]. I faxed 50 pages to them 01/13/2016 fax #[phone]. I received confirmation from fax that 50 pages were received by their office

## 2016-02-08 DIAGNOSIS — L818 Other specified disorders of pigmentation: Secondary | ICD-10-CM | POA: Diagnosis not present

## 2016-02-08 DIAGNOSIS — L309 Dermatitis, unspecified: Secondary | ICD-10-CM | POA: Diagnosis not present

## 2016-02-08 DIAGNOSIS — L853 Xerosis cutis: Secondary | ICD-10-CM | POA: Diagnosis not present

## 2016-02-09 DIAGNOSIS — K439 Ventral hernia without obstruction or gangrene: Secondary | ICD-10-CM | POA: Diagnosis not present

## 2016-02-09 DIAGNOSIS — I10 Essential (primary) hypertension: Secondary | ICD-10-CM | POA: Diagnosis not present

## 2016-02-09 DIAGNOSIS — R635 Abnormal weight gain: Secondary | ICD-10-CM | POA: Diagnosis not present

## 2016-02-09 DIAGNOSIS — N400 Enlarged prostate without lower urinary tract symptoms: Secondary | ICD-10-CM | POA: Diagnosis not present

## 2016-02-09 DIAGNOSIS — R5383 Other fatigue: Secondary | ICD-10-CM | POA: Diagnosis not present

## 2016-02-09 DIAGNOSIS — L299 Pruritus, unspecified: Secondary | ICD-10-CM | POA: Diagnosis not present

## 2016-02-28 DIAGNOSIS — I714 Abdominal aortic aneurysm, without rupture: Secondary | ICD-10-CM | POA: Diagnosis not present

## 2016-03-22 DIAGNOSIS — J449 Chronic obstructive pulmonary disease, unspecified: Secondary | ICD-10-CM | POA: Diagnosis not present

## 2016-03-22 DIAGNOSIS — Z87891 Personal history of nicotine dependence: Secondary | ICD-10-CM | POA: Diagnosis not present

## 2016-03-22 DIAGNOSIS — G4733 Obstructive sleep apnea (adult) (pediatric): Secondary | ICD-10-CM | POA: Diagnosis not present

## 2016-03-22 DIAGNOSIS — Z9989 Dependence on other enabling machines and devices: Secondary | ICD-10-CM | POA: Diagnosis not present

## 2016-04-16 DIAGNOSIS — R05 Cough: Secondary | ICD-10-CM | POA: Diagnosis not present

## 2016-04-27 DIAGNOSIS — J449 Chronic obstructive pulmonary disease, unspecified: Secondary | ICD-10-CM | POA: Diagnosis not present

## 2016-04-27 DIAGNOSIS — R938 Abnormal findings on diagnostic imaging of other specified body structures: Secondary | ICD-10-CM | POA: Diagnosis not present

## 2016-04-27 DIAGNOSIS — R918 Other nonspecific abnormal finding of lung field: Secondary | ICD-10-CM | POA: Diagnosis not present

## 2016-05-30 DIAGNOSIS — G4733 Obstructive sleep apnea (adult) (pediatric): Secondary | ICD-10-CM | POA: Diagnosis not present

## 2016-05-30 DIAGNOSIS — E669 Obesity, unspecified: Secondary | ICD-10-CM | POA: Diagnosis not present

## 2016-05-30 DIAGNOSIS — J441 Chronic obstructive pulmonary disease with (acute) exacerbation: Secondary | ICD-10-CM | POA: Diagnosis not present

## 2016-05-30 DIAGNOSIS — Z87891 Personal history of nicotine dependence: Secondary | ICD-10-CM | POA: Diagnosis not present

## 2016-06-12 DIAGNOSIS — Z87891 Personal history of nicotine dependence: Secondary | ICD-10-CM | POA: Diagnosis not present

## 2016-06-12 DIAGNOSIS — J449 Chronic obstructive pulmonary disease, unspecified: Secondary | ICD-10-CM | POA: Diagnosis not present

## 2016-06-12 DIAGNOSIS — G4733 Obstructive sleep apnea (adult) (pediatric): Secondary | ICD-10-CM | POA: Diagnosis not present

## 2016-06-12 DIAGNOSIS — E669 Obesity, unspecified: Secondary | ICD-10-CM | POA: Diagnosis not present

## 2016-06-15 DIAGNOSIS — R0602 Shortness of breath: Secondary | ICD-10-CM | POA: Diagnosis not present

## 2016-06-15 DIAGNOSIS — R0789 Other chest pain: Secondary | ICD-10-CM | POA: Diagnosis not present

## 2016-06-15 DIAGNOSIS — I10 Essential (primary) hypertension: Secondary | ICD-10-CM | POA: Diagnosis not present

## 2016-06-15 DIAGNOSIS — E785 Hyperlipidemia, unspecified: Secondary | ICD-10-CM | POA: Diagnosis not present

## 2016-06-15 DIAGNOSIS — N401 Enlarged prostate with lower urinary tract symptoms: Secondary | ICD-10-CM | POA: Diagnosis not present

## 2016-06-21 DIAGNOSIS — E785 Hyperlipidemia, unspecified: Secondary | ICD-10-CM | POA: Diagnosis not present

## 2016-06-21 DIAGNOSIS — J439 Emphysema, unspecified: Secondary | ICD-10-CM | POA: Diagnosis not present

## 2016-06-21 DIAGNOSIS — I10 Essential (primary) hypertension: Secondary | ICD-10-CM | POA: Diagnosis not present

## 2016-06-21 DIAGNOSIS — R0602 Shortness of breath: Secondary | ICD-10-CM | POA: Diagnosis not present

## 2016-06-26 DIAGNOSIS — Z87891 Personal history of nicotine dependence: Secondary | ICD-10-CM | POA: Diagnosis not present

## 2016-06-26 DIAGNOSIS — R0789 Other chest pain: Secondary | ICD-10-CM | POA: Diagnosis not present

## 2016-06-26 DIAGNOSIS — I2510 Atherosclerotic heart disease of native coronary artery without angina pectoris: Secondary | ICD-10-CM | POA: Diagnosis not present

## 2016-06-26 DIAGNOSIS — G4733 Obstructive sleep apnea (adult) (pediatric): Secondary | ICD-10-CM | POA: Diagnosis not present

## 2016-06-26 DIAGNOSIS — E119 Type 2 diabetes mellitus without complications: Secondary | ICD-10-CM | POA: Diagnosis not present

## 2016-06-26 DIAGNOSIS — E785 Hyperlipidemia, unspecified: Secondary | ICD-10-CM | POA: Diagnosis not present

## 2016-06-26 DIAGNOSIS — J449 Chronic obstructive pulmonary disease, unspecified: Secondary | ICD-10-CM | POA: Diagnosis not present

## 2016-06-26 DIAGNOSIS — I10 Essential (primary) hypertension: Secondary | ICD-10-CM | POA: Diagnosis not present

## 2016-06-26 DIAGNOSIS — R0602 Shortness of breath: Secondary | ICD-10-CM | POA: Diagnosis not present

## 2016-06-26 DIAGNOSIS — I272 Pulmonary hypertension, unspecified: Secondary | ICD-10-CM | POA: Diagnosis not present

## 2016-07-05 DIAGNOSIS — N401 Enlarged prostate with lower urinary tract symptoms: Secondary | ICD-10-CM | POA: Diagnosis not present

## 2016-07-05 DIAGNOSIS — R3915 Urgency of urination: Secondary | ICD-10-CM | POA: Diagnosis not present

## 2016-07-09 IMAGING — CR DG CHEST 2V
2 series · 2 of 2 positions shown · non-contrast
Comparison: None.

CLINICAL DATA: Chronic shortness of breath.

EXAM:
CHEST  2 VIEW

[view not recorded (1 of 2)]
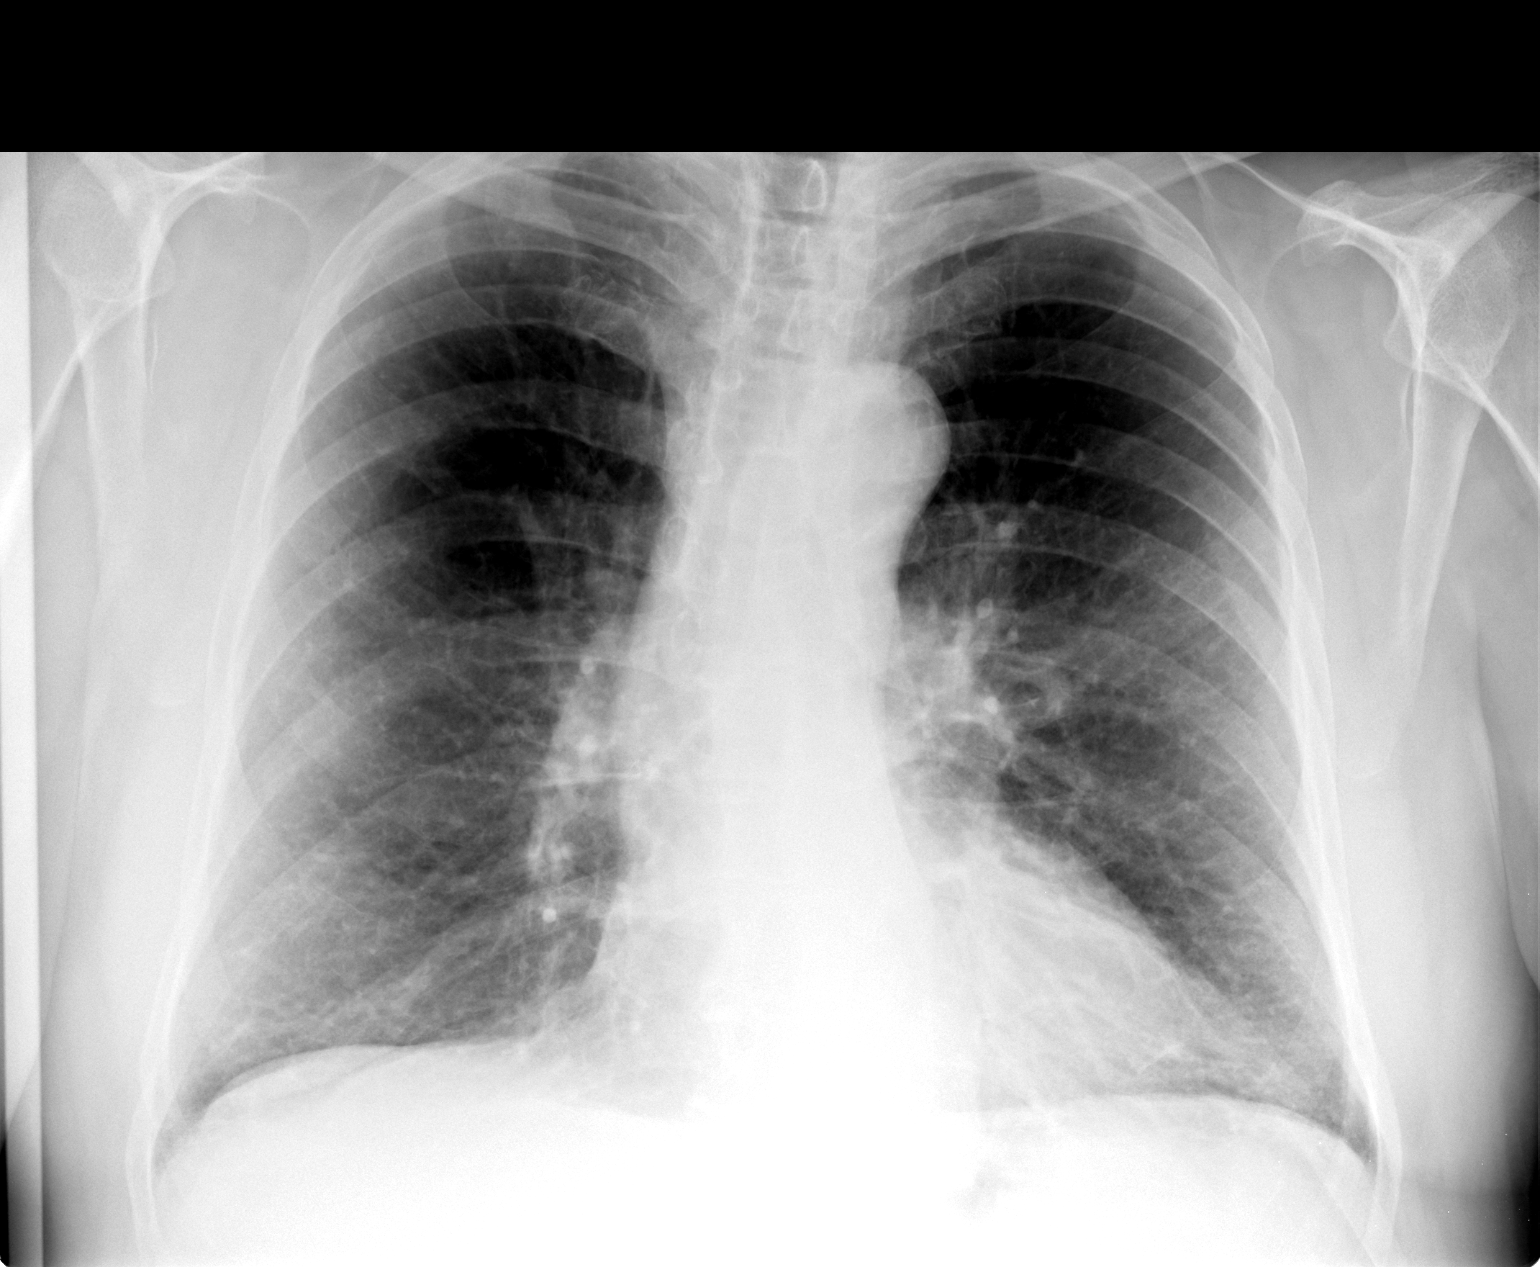

[view not recorded (2 of 2)]
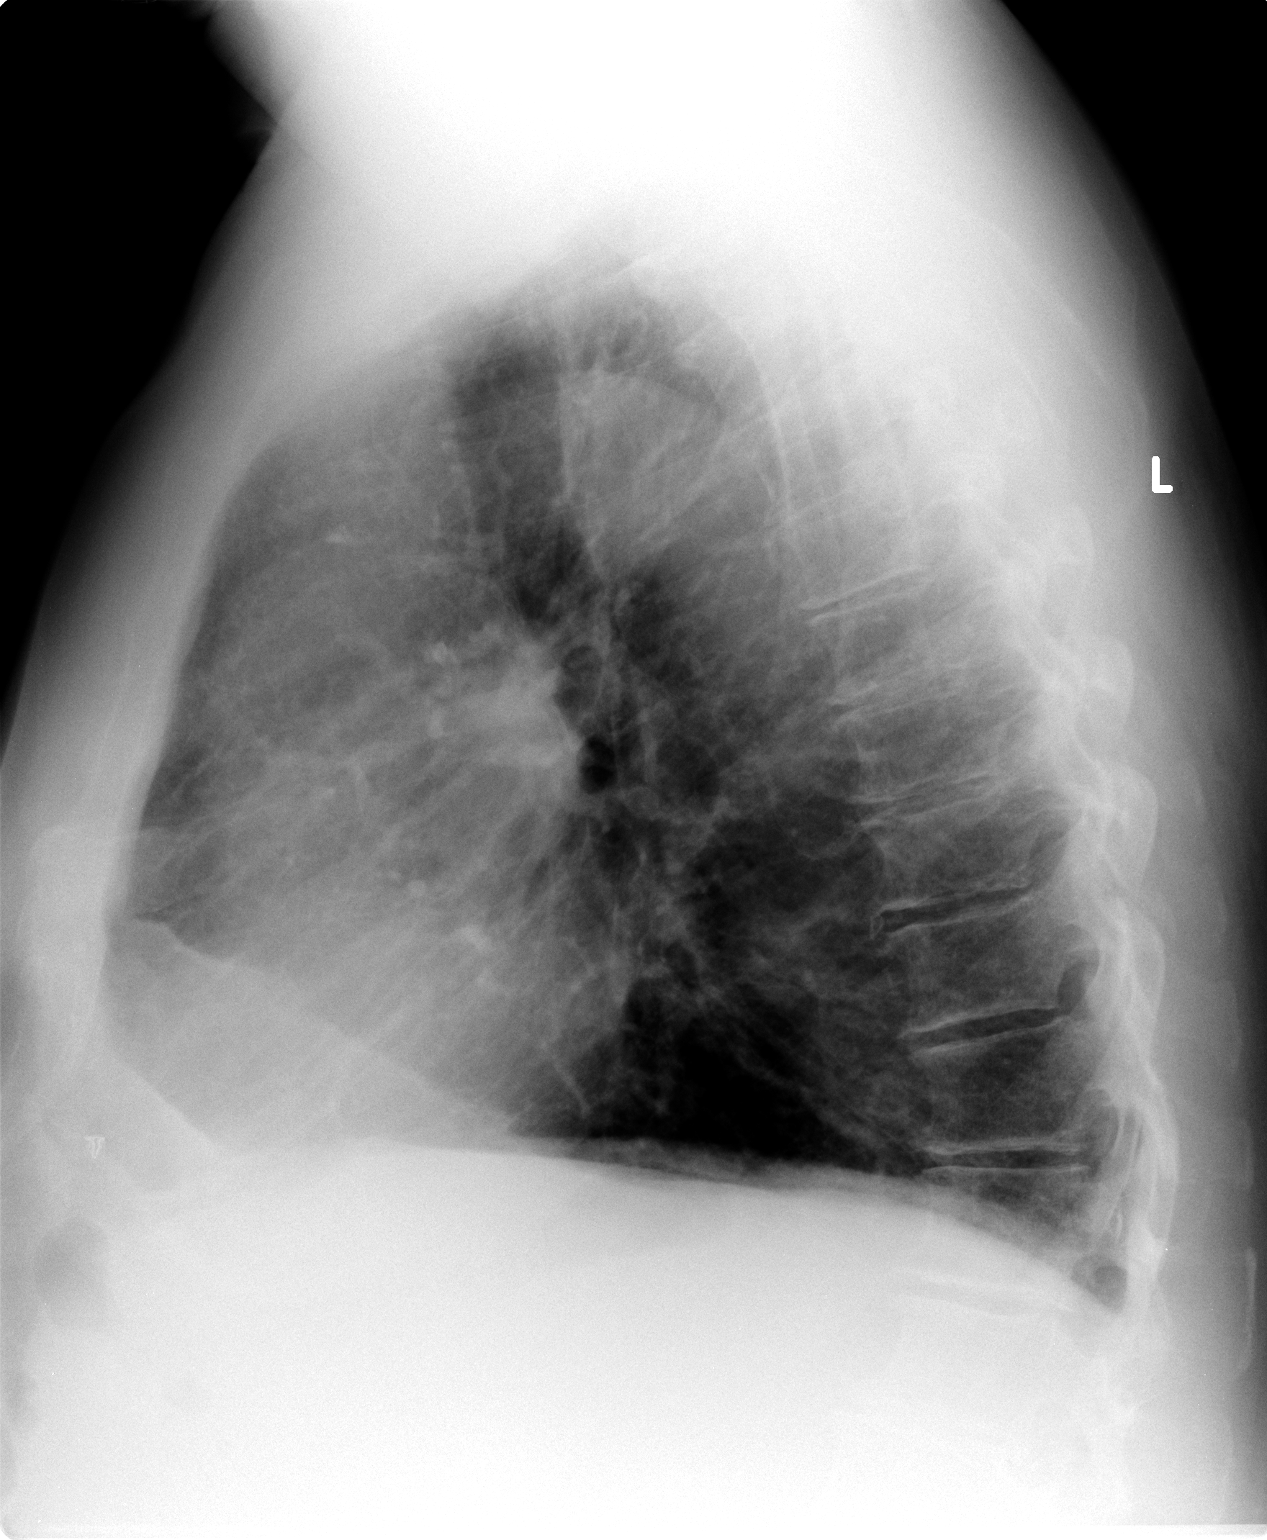

[2 of 2 positions shown; findings below may reference images not displayed]

FINDINGS: The heart size and mediastinal contours are within normal limits.
Both lungs are clear. No pneumothorax or pleural effusion is noted.
Multilevel degenerative disc disease is noted in the lumbar spine.
IMPRESSION: No active cardiopulmonary disease.

## 2016-07-10 DIAGNOSIS — I2510 Atherosclerotic heart disease of native coronary artery without angina pectoris: Secondary | ICD-10-CM | POA: Diagnosis not present

## 2016-07-10 DIAGNOSIS — I10 Essential (primary) hypertension: Secondary | ICD-10-CM | POA: Diagnosis not present

## 2016-07-10 DIAGNOSIS — E785 Hyperlipidemia, unspecified: Secondary | ICD-10-CM | POA: Diagnosis not present

## 2016-07-19 DIAGNOSIS — J449 Chronic obstructive pulmonary disease, unspecified: Secondary | ICD-10-CM | POA: Diagnosis not present

## 2016-07-19 DIAGNOSIS — Z87891 Personal history of nicotine dependence: Secondary | ICD-10-CM | POA: Diagnosis not present

## 2016-07-19 DIAGNOSIS — E669 Obesity, unspecified: Secondary | ICD-10-CM | POA: Diagnosis not present

## 2016-07-19 DIAGNOSIS — G4733 Obstructive sleep apnea (adult) (pediatric): Secondary | ICD-10-CM | POA: Diagnosis not present

## 2016-08-22 DIAGNOSIS — M79604 Pain in right leg: Secondary | ICD-10-CM | POA: Diagnosis not present

## 2016-08-22 DIAGNOSIS — M79605 Pain in left leg: Secondary | ICD-10-CM | POA: Diagnosis not present

## 2016-08-22 DIAGNOSIS — M7989 Other specified soft tissue disorders: Secondary | ICD-10-CM | POA: Diagnosis not present

## 2016-08-22 DIAGNOSIS — R609 Edema, unspecified: Secondary | ICD-10-CM | POA: Diagnosis not present

## 2016-08-22 DIAGNOSIS — I714 Abdominal aortic aneurysm, without rupture: Secondary | ICD-10-CM | POA: Diagnosis not present

## 2016-09-13 DIAGNOSIS — R600 Localized edema: Secondary | ICD-10-CM | POA: Diagnosis not present

## 2016-09-13 DIAGNOSIS — I83893 Varicose veins of bilateral lower extremities with other complications: Secondary | ICD-10-CM | POA: Diagnosis not present

## 2016-09-17 DIAGNOSIS — L309 Dermatitis, unspecified: Secondary | ICD-10-CM | POA: Diagnosis not present

## 2016-09-17 DIAGNOSIS — L821 Other seborrheic keratosis: Secondary | ICD-10-CM | POA: Diagnosis not present

## 2016-09-20 DIAGNOSIS — R609 Edema, unspecified: Secondary | ICD-10-CM | POA: Diagnosis not present

## 2016-09-20 DIAGNOSIS — I714 Abdominal aortic aneurysm, without rupture: Secondary | ICD-10-CM | POA: Diagnosis not present

## 2016-09-20 DIAGNOSIS — Z136 Encounter for screening for cardiovascular disorders: Secondary | ICD-10-CM | POA: Diagnosis not present

## 2016-09-20 DIAGNOSIS — M7989 Other specified soft tissue disorders: Secondary | ICD-10-CM | POA: Diagnosis not present

## 2016-10-23 DIAGNOSIS — I10 Essential (primary) hypertension: Secondary | ICD-10-CM | POA: Diagnosis not present

## 2016-10-23 DIAGNOSIS — J449 Chronic obstructive pulmonary disease, unspecified: Secondary | ICD-10-CM | POA: Diagnosis not present

## 2016-10-23 DIAGNOSIS — R609 Edema, unspecified: Secondary | ICD-10-CM | POA: Diagnosis not present

## 2016-10-23 DIAGNOSIS — I2510 Atherosclerotic heart disease of native coronary artery without angina pectoris: Secondary | ICD-10-CM | POA: Diagnosis not present

## 2016-11-14 DIAGNOSIS — Z0000 Encounter for general adult medical examination without abnormal findings: Secondary | ICD-10-CM | POA: Diagnosis not present

## 2016-11-15 DIAGNOSIS — E119 Type 2 diabetes mellitus without complications: Secondary | ICD-10-CM | POA: Diagnosis not present

## 2016-11-15 DIAGNOSIS — E785 Hyperlipidemia, unspecified: Secondary | ICD-10-CM | POA: Diagnosis not present

## 2016-11-15 DIAGNOSIS — I2510 Atherosclerotic heart disease of native coronary artery without angina pectoris: Secondary | ICD-10-CM | POA: Diagnosis not present

## 2016-11-15 DIAGNOSIS — Z125 Encounter for screening for malignant neoplasm of prostate: Secondary | ICD-10-CM | POA: Diagnosis not present

## 2016-11-15 DIAGNOSIS — I10 Essential (primary) hypertension: Secondary | ICD-10-CM | POA: Diagnosis not present

## 2016-11-19 DIAGNOSIS — G4733 Obstructive sleep apnea (adult) (pediatric): Secondary | ICD-10-CM | POA: Diagnosis not present

## 2016-11-19 DIAGNOSIS — Z87891 Personal history of nicotine dependence: Secondary | ICD-10-CM | POA: Diagnosis not present

## 2016-11-19 DIAGNOSIS — J449 Chronic obstructive pulmonary disease, unspecified: Secondary | ICD-10-CM | POA: Diagnosis not present

## 2016-11-19 DIAGNOSIS — E669 Obesity, unspecified: Secondary | ICD-10-CM | POA: Diagnosis not present

## 2016-11-22 DIAGNOSIS — M545 Low back pain: Secondary | ICD-10-CM | POA: Diagnosis not present

## 2016-11-22 DIAGNOSIS — M5431 Sciatica, right side: Secondary | ICD-10-CM | POA: Diagnosis not present

## 2016-11-22 DIAGNOSIS — M5432 Sciatica, left side: Secondary | ICD-10-CM | POA: Diagnosis not present

## 2016-12-25 DIAGNOSIS — I83893 Varicose veins of bilateral lower extremities with other complications: Secondary | ICD-10-CM | POA: Diagnosis not present

## 2016-12-26 DIAGNOSIS — N401 Enlarged prostate with lower urinary tract symptoms: Secondary | ICD-10-CM | POA: Diagnosis not present

## 2017-01-03 DIAGNOSIS — N401 Enlarged prostate with lower urinary tract symptoms: Secondary | ICD-10-CM | POA: Diagnosis not present

## 2017-01-24 DIAGNOSIS — Z23 Encounter for immunization: Secondary | ICD-10-CM | POA: Diagnosis not present

## 2017-01-24 DIAGNOSIS — I2510 Atherosclerotic heart disease of native coronary artery without angina pectoris: Secondary | ICD-10-CM | POA: Diagnosis not present

## 2017-01-24 DIAGNOSIS — M545 Low back pain: Secondary | ICD-10-CM | POA: Diagnosis not present

## 2017-01-24 DIAGNOSIS — M5412 Radiculopathy, cervical region: Secondary | ICD-10-CM | POA: Diagnosis not present

## 2017-02-22 DIAGNOSIS — M545 Low back pain: Secondary | ICD-10-CM | POA: Diagnosis not present

## 2017-02-22 DIAGNOSIS — M5412 Radiculopathy, cervical region: Secondary | ICD-10-CM | POA: Diagnosis not present

## 2017-02-25 DIAGNOSIS — M5412 Radiculopathy, cervical region: Secondary | ICD-10-CM | POA: Diagnosis not present

## 2017-02-25 DIAGNOSIS — M545 Low back pain: Secondary | ICD-10-CM | POA: Diagnosis not present

## 2017-02-27 DIAGNOSIS — M545 Low back pain: Secondary | ICD-10-CM | POA: Diagnosis not present

## 2017-02-27 DIAGNOSIS — M5412 Radiculopathy, cervical region: Secondary | ICD-10-CM | POA: Diagnosis not present

## 2017-03-04 DIAGNOSIS — M5412 Radiculopathy, cervical region: Secondary | ICD-10-CM | POA: Diagnosis not present

## 2017-03-04 DIAGNOSIS — M545 Low back pain: Secondary | ICD-10-CM | POA: Diagnosis not present

## 2017-03-05 DIAGNOSIS — H02831 Dermatochalasis of right upper eyelid: Secondary | ICD-10-CM | POA: Diagnosis not present

## 2017-03-05 DIAGNOSIS — H43813 Vitreous degeneration, bilateral: Secondary | ICD-10-CM | POA: Diagnosis not present

## 2017-03-05 DIAGNOSIS — H5703 Miosis: Secondary | ICD-10-CM | POA: Diagnosis not present

## 2017-03-05 DIAGNOSIS — H2513 Age-related nuclear cataract, bilateral: Secondary | ICD-10-CM | POA: Diagnosis not present

## 2017-03-06 DIAGNOSIS — M545 Low back pain: Secondary | ICD-10-CM | POA: Diagnosis not present

## 2017-03-06 DIAGNOSIS — M5412 Radiculopathy, cervical region: Secondary | ICD-10-CM | POA: Diagnosis not present

## 2017-03-08 DIAGNOSIS — R05 Cough: Secondary | ICD-10-CM | POA: Diagnosis not present

## 2017-03-08 DIAGNOSIS — R0602 Shortness of breath: Secondary | ICD-10-CM | POA: Diagnosis not present

## 2017-03-08 DIAGNOSIS — J439 Emphysema, unspecified: Secondary | ICD-10-CM | POA: Diagnosis not present

## 2017-03-11 DIAGNOSIS — M5412 Radiculopathy, cervical region: Secondary | ICD-10-CM | POA: Diagnosis not present

## 2017-03-11 DIAGNOSIS — M545 Low back pain: Secondary | ICD-10-CM | POA: Diagnosis not present

## 2017-03-13 DIAGNOSIS — M5412 Radiculopathy, cervical region: Secondary | ICD-10-CM | POA: Diagnosis not present

## 2017-03-13 DIAGNOSIS — M545 Low back pain: Secondary | ICD-10-CM | POA: Diagnosis not present

## 2017-03-18 DIAGNOSIS — M545 Low back pain: Secondary | ICD-10-CM | POA: Diagnosis not present

## 2017-03-18 DIAGNOSIS — M5412 Radiculopathy, cervical region: Secondary | ICD-10-CM | POA: Diagnosis not present

## 2017-03-20 DIAGNOSIS — M5412 Radiculopathy, cervical region: Secondary | ICD-10-CM | POA: Diagnosis not present

## 2017-03-20 DIAGNOSIS — M545 Low back pain: Secondary | ICD-10-CM | POA: Diagnosis not present

## 2017-03-21 DIAGNOSIS — J449 Chronic obstructive pulmonary disease, unspecified: Secondary | ICD-10-CM | POA: Diagnosis not present

## 2017-03-21 DIAGNOSIS — Z87891 Personal history of nicotine dependence: Secondary | ICD-10-CM | POA: Diagnosis not present

## 2017-03-21 DIAGNOSIS — M4802 Spinal stenosis, cervical region: Secondary | ICD-10-CM | POA: Diagnosis not present

## 2017-03-21 DIAGNOSIS — G8929 Other chronic pain: Secondary | ICD-10-CM | POA: Diagnosis not present

## 2017-03-21 DIAGNOSIS — M545 Low back pain: Secondary | ICD-10-CM | POA: Diagnosis not present

## 2017-03-21 DIAGNOSIS — E669 Obesity, unspecified: Secondary | ICD-10-CM | POA: Diagnosis not present

## 2017-03-21 DIAGNOSIS — M5412 Radiculopathy, cervical region: Secondary | ICD-10-CM | POA: Diagnosis not present

## 2017-03-21 DIAGNOSIS — G4733 Obstructive sleep apnea (adult) (pediatric): Secondary | ICD-10-CM | POA: Diagnosis not present

## 2020-09-07 DEATH — deceased
